# Patient Record
Sex: Female | Born: 1985 | Race: Black or African American | Hispanic: No | Smoking: Never smoker
Health system: Southern US, Community
[De-identification: ages and names within clinical notes are randomized; demographics above are authoritative.]

## PROBLEM LIST (undated history)

## (undated) ENCOUNTER — Emergency Department (HOSPITAL_COMMUNITY): Admission: EM | Payer: Self-pay | Source: Home / Self Care

## (undated) HISTORY — PX: MULTIPLE TOOTH EXTRACTIONS: SHX2053

---

## 2006-08-05 ENCOUNTER — Emergency Department (HOSPITAL_COMMUNITY): Admission: EM | Admit: 2006-08-05 | Discharge: 2006-08-05 | Payer: Self-pay | Admitting: Emergency Medicine

## 2011-11-23 ENCOUNTER — Inpatient Hospital Stay (HOSPITAL_COMMUNITY)
Admission: AD | Admit: 2011-11-23 | Discharge: 2011-11-23 | Discharge status: Against Medical Advice | Payer: Medicaid Other | Source: Ambulatory Visit | Attending: Obstetrics & Gynecology | Admitting: Obstetrics & Gynecology

## 2011-11-23 DIAGNOSIS — O99891 Other specified diseases and conditions complicating pregnancy: Secondary | ICD-10-CM | POA: Insufficient documentation

## 2011-11-23 DIAGNOSIS — N6009 Solitary cyst of unspecified breast: Secondary | ICD-10-CM | POA: Insufficient documentation

## 2011-11-23 DIAGNOSIS — O210 Mild hyperemesis gravidarum: Secondary | ICD-10-CM | POA: Insufficient documentation

## 2011-11-23 LAB — URINALYSIS, ROUTINE W REFLEX MICROSCOPIC
Hgb urine dipstick: NEGATIVE
Leukocytes, UA: NEGATIVE
Nitrite: NEGATIVE
Specific Gravity, Urine: 1.015 (ref 1.005–1.030)
Urobilinogen, UA: 1 mg/dL (ref 0.0–1.0)

## 2011-11-23 LAB — POCT PREGNANCY, URINE: Preg Test, Ur: POSITIVE — AB

## 2011-11-23 NOTE — MAU Note (Signed)
Patient states she has had a positive pregnancy test at a non [facility] on 6-5. Was told she had cysts in the breast and was told she needed a mammogram. Patient denies any bleeding or vaginal discharge. Has had nausea and vomiting 2-3 times a day for 2 weeks. Diarrhea daily for one week, once today.

## 2011-11-26 ENCOUNTER — Encounter (HOSPITAL_COMMUNITY): Payer: Self-pay | Admitting: *Deleted

## 2011-11-29 ENCOUNTER — Encounter (HOSPITAL_COMMUNITY): Payer: Self-pay | Admitting: *Deleted

## 2017-01-24 DIAGNOSIS — Z01419 Encounter for gynecological examination (general) (routine) without abnormal findings: Secondary | ICD-10-CM | POA: Diagnosis not present

## 2017-01-24 DIAGNOSIS — Z6835 Body mass index (BMI) 35.0-35.9, adult: Secondary | ICD-10-CM | POA: Diagnosis not present

## 2017-03-06 DIAGNOSIS — F419 Anxiety disorder, unspecified: Secondary | ICD-10-CM | POA: Diagnosis not present

## 2017-04-04 DIAGNOSIS — Z23 Encounter for immunization: Secondary | ICD-10-CM | POA: Diagnosis not present

## 2017-04-04 DIAGNOSIS — F419 Anxiety disorder, unspecified: Secondary | ICD-10-CM | POA: Diagnosis not present

## 2017-06-27 DIAGNOSIS — K080 Exfoliation of teeth due to systemic causes: Secondary | ICD-10-CM | POA: Diagnosis not present

## 2017-08-29 DIAGNOSIS — R109 Unspecified abdominal pain: Secondary | ICD-10-CM | POA: Diagnosis not present

## 2017-09-24 DIAGNOSIS — K080 Exfoliation of teeth due to systemic causes: Secondary | ICD-10-CM | POA: Diagnosis not present

## 2017-10-23 DIAGNOSIS — F419 Anxiety disorder, unspecified: Secondary | ICD-10-CM | POA: Diagnosis not present

## 2017-12-09 DIAGNOSIS — Z3202 Encounter for pregnancy test, result negative: Secondary | ICD-10-CM | POA: Diagnosis not present

## 2017-12-09 DIAGNOSIS — Z30430 Encounter for insertion of intrauterine contraceptive device: Secondary | ICD-10-CM | POA: Diagnosis not present

## 2018-01-06 DIAGNOSIS — N939 Abnormal uterine and vaginal bleeding, unspecified: Secondary | ICD-10-CM | POA: Diagnosis not present

## 2018-02-17 ENCOUNTER — Other Ambulatory Visit: Payer: Self-pay

## 2018-02-17 ENCOUNTER — Emergency Department (HOSPITAL_COMMUNITY)
Admission: EM | Admit: 2018-02-17 | Discharge: 2018-02-17 | Disposition: A | Discharge status: Home / Self Care | Payer: Federal, State, Local not specified - PPO | Attending: Emergency Medicine | Admitting: Emergency Medicine

## 2018-02-17 ENCOUNTER — Encounter (HOSPITAL_COMMUNITY): Payer: Self-pay

## 2018-02-17 ENCOUNTER — Emergency Department (HOSPITAL_COMMUNITY): Payer: Federal, State, Local not specified - PPO

## 2018-02-17 DIAGNOSIS — N760 Acute vaginitis: Secondary | ICD-10-CM | POA: Diagnosis not present

## 2018-02-17 DIAGNOSIS — B9689 Other specified bacterial agents as the cause of diseases classified elsewhere: Secondary | ICD-10-CM | POA: Diagnosis not present

## 2018-02-17 DIAGNOSIS — N83201 Unspecified ovarian cyst, right side: Secondary | ICD-10-CM | POA: Insufficient documentation

## 2018-02-17 DIAGNOSIS — R102 Pelvic and perineal pain: Secondary | ICD-10-CM | POA: Diagnosis not present

## 2018-02-17 LAB — CBC WITH DIFFERENTIAL/PLATELET
BASOS PCT: 0 %
Basophils Absolute: 0 10*3/uL (ref 0.0–0.1)
EOS ABS: 0.2 10*3/uL (ref 0.0–0.7)
Eosinophils Relative: 4 %
HCT: 37.4 % (ref 36.0–46.0)
Hemoglobin: 12.1 g/dL (ref 12.0–15.0)
LYMPHS ABS: 1.7 10*3/uL (ref 0.7–4.0)
Lymphocytes Relative: 34 %
MCH: 28.3 pg (ref 26.0–34.0)
MCHC: 32.4 g/dL (ref 30.0–36.0)
MCV: 87.6 fL (ref 78.0–100.0)
Monocytes Absolute: 0.6 10*3/uL (ref 0.1–1.0)
Monocytes Relative: 11 %
NEUTROS ABS: 2.6 10*3/uL (ref 1.7–7.7)
NEUTROS PCT: 51 %
Platelets: 382 10*3/uL (ref 150–400)
RBC: 4.27 MIL/uL (ref 3.87–5.11)
RDW: 13.5 % (ref 11.5–15.5)
WBC: 5.1 10*3/uL (ref 4.0–10.5)

## 2018-02-17 LAB — WET PREP, GENITAL
Sperm: NONE SEEN
TRICH WET PREP: NONE SEEN
Yeast Wet Prep HPF POC: NONE SEEN

## 2018-02-17 LAB — URINALYSIS, ROUTINE W REFLEX MICROSCOPIC
Bilirubin Urine: NEGATIVE
GLUCOSE, UA: NEGATIVE mg/dL
Hgb urine dipstick: NEGATIVE
Ketones, ur: NEGATIVE mg/dL
LEUKOCYTES UA: NEGATIVE
Nitrite: NEGATIVE
PH: 7 (ref 5.0–8.0)
Protein, ur: NEGATIVE mg/dL
SPECIFIC GRAVITY, URINE: 1.02 (ref 1.005–1.030)

## 2018-02-17 LAB — COMPREHENSIVE METABOLIC PANEL
ALK PHOS: 61 U/L (ref 38–126)
ALT: 14 U/L (ref 0–44)
ANION GAP: 8 (ref 5–15)
AST: 17 U/L (ref 15–41)
Albumin: 3.6 g/dL (ref 3.5–5.0)
BUN: 10 mg/dL (ref 6–20)
CALCIUM: 9 mg/dL (ref 8.9–10.3)
CO2: 27 mmol/L (ref 22–32)
Chloride: 107 mmol/L (ref 98–111)
Creatinine, Ser: 0.61 mg/dL (ref 0.44–1.00)
GFR calc non Af Amer: >60 mL/min (ref >60)
Glucose, Bld: 86 mg/dL (ref 70–99)
POTASSIUM: 3.8 mmol/L (ref 3.5–5.1)
SODIUM: 142 mmol/L (ref 135–145)
Total Bilirubin: 0.4 mg/dL (ref 0.3–1.2)
Total Protein: 6.7 g/dL (ref 6.5–8.1)

## 2018-02-17 LAB — LIPASE, BLOOD: Lipase: 37 U/L (ref 11–51)

## 2018-02-17 LAB — POC URINE PREG, ED: PREG TEST UR: NEGATIVE

## 2018-02-17 MED ORDER — METRONIDAZOLE 500 MG PO TABS: 500.0000 mg | ORAL_TABLET | Freq: Two times a day (BID) | ORAL | 0 refills | Status: DC

## 2018-02-17 MED ORDER — KETOROLAC TROMETHAMINE 15 MG/ML IJ SOLN
15.0000 mg | Freq: Once | INTRAMUSCULAR | Status: AC
  Administered 2018-02-17: 15 mg via INTRAVENOUS
  Filled 2018-02-17: qty 1

## 2018-02-17 MED ORDER — NAPROXEN 500 MG PO TABS: 500.0000 mg | ORAL_TABLET | Freq: Two times a day (BID) | ORAL | 0 refills | Status: DC

## 2018-02-17 NOTE — Discharge Instructions (Addendum)
You were seen in the emergency department today for abdominal pain.  Your lab work was all normal with the exception of the blood prep we did with your pelvic exam which showed findings consistent with bacterial vaginosis, please see the attached handout for further information regarding this diagnosis.  We are treating this infection with Flagyl, this is an antibiotic, please complete the course of antibiotics prescribed to you.  You may not consume alcohol when taking this antibiotic as it can have severe side effects.  Your ultrasound showed that you have a likely ovarian cyst on the right side that is 7.5 cm in size.  We are treating this with anti-inflammatory medicines, we are specifically give you a prescription for naproxen. - Naproxen is a nonsteroidal anti-inflammatory medication that will help with pain and swelling. Be sure to take this medication as prescribed with food, 1 pill every 12 hours,  It should be taken with food, as it can cause stomach upset, and more seriously, stomach bleeding. Do not take other nonsteroidal anti-inflammatory medications with this such as Advil, Motrin, Aleve, Mobic, Goodie Powder, or Motrin.    You make take Tylenol per over the counter dosing with these medications.   We have prescribed you new medication(s) today. Discuss the medications prescribed today with your pharmacist as they can have adverse effects and interactions with your other medicines including over the counter and prescribed medications. Seek medical evaluation if you start to experience new or abnormal symptoms after taking one of these medicines, seek care immediately if you start to experience difficulty breathing, feeling of your throat closing, facial swelling, or rash as these could be indications of a more serious allergic reaction.   We would like you to call your OB/GYN provider tomorrow morning in order to set up a follow-up appointment sometime this week.  Return to the ER for new or  worsening symptoms including but not limited to worsening pain, vomiting, fever, or any other concerns that you may have.

## 2018-02-17 NOTE — ED Provider Notes (Signed)
[facility]-EMERGENCY DEPT Provider Note   CSN: [id number] Arrival date & time: 02/17/18  1358     History   Chief Complaint Chief Complaint  Patient presents with  . Abdominal Pain    HPI Michelle Wade is a 32 y.o. female without significant past medical history who presents to the emergency department today with complaints of pelvic pain that started this AM when she woke up.  Patient describes the pain as being in the right suprapubic/pelvic region.  Pain has been constant since onset this morning.  Pain is currently a 2 out of 10 in severity, it is worse with walking around and movement, improved with rest.  She reports that she had a similar pain 2 months prior which lasted for 3 to 4 days, she was seen by OB/GYN provider and ultrasound was recommended-she did not have this completed as the pain had spontaneously resolved.  Today with the first time she has had recurrence of pain.  She states that 2 months ago she had a Mirena placed (first episode of similar pain was prior to mirena placement).  She states that she did have approximately 20 days of light menstrual bleeding, this resolved and she did have a little bit of bleeding/spotting yesterday, none today.  She has had some urinary frequency as well as white vaginal discharge- discharge has been occurring since IUD placement.  Denies fever, chills, nausea, vomiting, decreased appetite, diarrhea, blood in stool, or dysuria.  Patient is currently sexually active in a monogamous relationship with her husband.  HPI  No past medical history on file.  There are no active problems to display for this patient.   History reviewed. No pertinent surgical history.   OB History   None      Home Medications    Prior to Admission medications   Not on File    Family History No family history on file.  Social History Social History   Tobacco Use  . Smoking status: Never Smoker  . Smokeless tobacco:  Never Used  Substance Use Topics  . Alcohol use: Not on file  . Drug use: Not on file     Allergies   Patient has no known allergies.   Review of Systems Review of Systems  Constitutional: Negative for appetite change, chills and fever.  Respiratory: Negative for shortness of breath.   Cardiovascular: Negative for chest pain.  Gastrointestinal: Negative for anal bleeding, blood in stool, constipation, diarrhea, nausea and vomiting.  Genitourinary: Positive for frequency, pelvic pain, vaginal bleeding (yesterday, resolved today) and vaginal discharge. Negative for dysuria.  All other systems reviewed and are negative.   Physical Exam Updated Vital Signs BP 121/86 (BP Location: Left Arm)   Pulse 74   Temp 98.7 F (37.1 C) (Oral)   Resp 16   Wt 94.1 kg   LMP 02/04/2018 (Approximate)   SpO2 95%   BMI 33.50 kg/m   Physical Exam  Constitutional: She appears well-developed and well-nourished.  Non-toxic appearance. No distress.  HENT:  Head: Normocephalic and atraumatic.  Eyes: Conjunctivae are normal. Right eye exhibits no discharge. Left eye exhibits no discharge.  Neck: Neck supple.  Cardiovascular: Normal rate and regular rhythm.  Pulmonary/Chest: Effort normal and breath sounds normal. No respiratory distress. She has no wheezes. She has no rhonchi. She has no rales.  Respiration even and unlabored  Abdominal: Soft. She exhibits no distension. There is tenderness (Patient with diffuse lower abdominal tenderness, most prominent in the right suprapubic  region.). There is no rigidity, no rebound, no guarding and no CVA tenderness.  Genitourinary: Pelvic exam was performed with patient supine. There is no lesion on the right labia. There is no lesion on the left labia. Cervix exhibits discharge (white thin). Cervix exhibits no friability. Right adnexum displays tenderness. Right adnexum displays no mass and no fullness. Left adnexum displays tenderness. Left adnexum displays no  mass and no fullness. No erythema or bleeding in the vagina. No foreign body in the vagina. Vaginal discharge (thin white) found.  Genitourinary Comments: Patient diffusely tender throughout pelvic exam, right adnexa is most significant area of tenderness.  Cervical os is closed with visible IUD strings.   EDT present as chaperone.   Neurological: She is alert.  Clear speech.   Skin: Skin is warm and dry. No rash noted.  Psychiatric: She has a normal mood and affect. Her behavior is normal.  Nursing note and vitals reviewed.   ED Treatments / Results  Labs (all labs ordered are listed, but only abnormal results are displayed) Labs Reviewed  WET PREP, GENITAL - Abnormal; Notable for the following components:      Result Value   Clue Cells Wet Prep HPF POC PRESENT (*)    WBC, Wet Prep HPF POC FEW (*)    All other components within normal limits  COMPREHENSIVE METABOLIC PANEL  LIPASE, BLOOD  CBC WITH DIFFERENTIAL/PLATELET  URINALYSIS, ROUTINE W REFLEX MICROSCOPIC  RPR  HIV ANTIBODY (ROUTINE TESTING)  POC URINE PREG, ED  GC/CHLAMYDIA PROBE AMP ([place]) NOT AT [facility]    EKG None  Radiology US Transvaginal Non-ob  Result Date: 02/17/2018 CLINICAL DATA:  Right pelvic pain for 5 months EXAM: TRANSABDOMINAL AND TRANSVAGINAL ULTRASOUND OF PELVIS DOPPLER ULTRASOUND OF OVARIES TECHNIQUE: Both transabdominal and transvaginal ultrasound examinations of the pelvis were performed. Transabdominal technique was performed for global imaging of the pelvis including uterus, ovaries, adnexal regions, and pelvic cul-de-sac. It was necessary to proceed with endovaginal exam following the transabdominal exam to visualize the ovaries. Color and duplex Doppler ultrasound was utilized to evaluate blood flow to the ovaries. COMPARISON:  None. FINDINGS: Uterus Measurements: 9.7 x 4.6 x 5 cm. No fibroids or other mass visualized. Nabothian cyst in the cervix. Endometrium Thickness: 8 mm.  Intrauterine device  in place Right ovary Measurements: 7.3 x 6.7 x 8.3 cm.  Cyst measuring 5.8 x 7.5 x 7.4 cm Left ovary Measurements: 4.1 x 2.1 x 2.5 cm.  Cyst measuring 3.2 x 1.3 x 1.6 cm Pulsed Doppler evaluation of both ovaries demonstrates normal low-resistance arterial and venous waveforms. Other findings No abnormal free fluid. IMPRESSION: 1. Negative for ovarian torsion. 2. 7.5 cm probable cyst in the right ovary, since these may be difficult to assess completely with [place], further evaluation of simple-appearing cysts >7 cm with MRI or surgical evaluation is recommended according to the Society of Radiologists in Ultrasound 2010 Consensus Conference Statement (D Lenis Noon et al. Management of Asymptomatic Ovarian and other Adnexal Cysts Imaged at [place]: Society of Radiologists in Ultrasound Consensus Conference Statement 2010. Radiology 256 (Sept 2010): 943-954.). This may be performed on a nonemergent basis. 3. Intrauterine device appears appropriately positioned by sonography Electronically Signed   By: Jasmine Pang M.D.   On: 02/17/2018 18:42   US Pelvis Complete  Result Date: 02/17/2018 CLINICAL DATA:  Right pelvic pain for 5 months EXAM: TRANSABDOMINAL AND TRANSVAGINAL ULTRASOUND OF PELVIS DOPPLER ULTRASOUND OF OVARIES TECHNIQUE: Both transabdominal and transvaginal ultrasound examinations of the pelvis were performed.  Transabdominal technique was performed for global imaging of the pelvis including uterus, ovaries, adnexal regions, and pelvic cul-de-sac. It was necessary to proceed with endovaginal exam following the transabdominal exam to visualize the ovaries. Color and duplex Doppler ultrasound was utilized to evaluate blood flow to the ovaries. COMPARISON:  None. FINDINGS: Uterus Measurements: 9.7 x 4.6 x 5 cm. No fibroids or other mass visualized. Nabothian cyst in the cervix. Endometrium Thickness: 8 mm.  Intrauterine device in place Right ovary Measurements: 7.3 x 6.7 x 8.3 cm.  Cyst measuring 5.8 x 7.5 x 7.4 cm Left  ovary Measurements: 4.1 x 2.1 x 2.5 cm.  Cyst measuring 3.2 x 1.3 x 1.6 cm Pulsed Doppler evaluation of both ovaries demonstrates normal low-resistance arterial and venous waveforms. Other findings No abnormal free fluid. IMPRESSION: 1. Negative for ovarian torsion. 2. 7.5 cm probable cyst in the right ovary, since these may be difficult to assess completely with [place], further evaluation of simple-appearing cysts >7 cm with MRI or surgical evaluation is recommended according to the Society of Radiologists in Ultrasound 2010 Consensus Conference Statement (D Lenis Noon et al. Management of Asymptomatic Ovarian and other Adnexal Cysts Imaged at [place]: Society of Radiologists in Ultrasound Consensus Conference Statement 2010. Radiology 256 (Sept 2010): 943-954.). This may be performed on a nonemergent basis. 3. Intrauterine device appears appropriately positioned by sonography Electronically Signed   By: Jasmine Pang M.D.   On: 02/17/2018 18:42   [place] Art/ven Flow Abd Pelv Doppler  Result Date: 02/17/2018 CLINICAL DATA:  Right pelvic pain for 5 months EXAM: TRANSABDOMINAL AND TRANSVAGINAL ULTRASOUND OF PELVIS DOPPLER ULTRASOUND OF OVARIES TECHNIQUE: Both transabdominal and transvaginal ultrasound examinations of the pelvis were performed. Transabdominal technique was performed for global imaging of the pelvis including uterus, ovaries, adnexal regions, and pelvic cul-de-sac. It was necessary to proceed with endovaginal exam following the transabdominal exam to visualize the ovaries. Color and duplex Doppler ultrasound was utilized to evaluate blood flow to the ovaries. COMPARISON:  None. FINDINGS: Uterus Measurements: 9.7 x 4.6 x 5 cm. No fibroids or other mass visualized. Nabothian cyst in the cervix. Endometrium Thickness: 8 mm.  Intrauterine device in place Right ovary Measurements: 7.3 x 6.7 x 8.3 cm.  Cyst measuring 5.8 x 7.5 x 7.4 cm Left ovary Measurements: 4.1 x 2.1 x 2.5 cm.  Cyst measuring 3.2 x 1.3 x 1.6 cm Pulsed  Doppler evaluation of both ovaries demonstrates normal low-resistance arterial and venous waveforms. Other findings No abnormal free fluid. IMPRESSION: 1. Negative for ovarian torsion. 2. 7.5 cm probable cyst in the right ovary, since these may be difficult to assess completely with [place], further evaluation of simple-appearing cysts >7 cm with MRI or surgical evaluation is recommended according to the Society of Radiologists in Ultrasound 2010 Consensus Conference Statement (D Lenis Noon et al. Management of Asymptomatic Ovarian and other Adnexal Cysts Imaged at [place]: Society of Radiologists in Ultrasound Consensus Conference Statement 2010. Radiology 256 (Sept 2010): 943-954.). This may be performed on a nonemergent basis. 3. Intrauterine device appears appropriately positioned by sonography Electronically Signed   By: Jasmine Pang M.D.   On: 02/17/2018 18:42    Procedures Procedures (including critical care time)  Medications Ordered in ED Medications  ketorolac (TORADOL) 15 MG/ML injection 15 mg (15 mg Intravenous Given 02/17/18 1932)     Initial Impression / Assessment and Plan / ED Course  I have reviewed the triage vital signs and the nursing notes.  Pertinent labs & imaging results that were available during  my care of the patient were reviewed by me and considered in my medical decision making (see chart for details).   Patient presents to the emergency department with complaints of right-sided pelvic pain.  Patient nontoxic-appearing, no apparent distress, vitals WNL.  On exam patient is tender to the diffuse lower abdomen, R>L, as well as with bimanual exam with R adnexal area being most prominent area of tenderness.  Will further evaluate with abdominal labs as well as transvaginal/pelvic ultrasound.  Work-up reviewed and fairly unremarkable.  Labs without leukocytosis, anemia, or electrolyte disturbance.  Renal function, LFTs, and lipase are within normal limits.  Patient's urinalysis does not  appear consistent with obvious infection.  Pregnancy test is negative, therefore do not suspect ectopic pregnancy.  Her wet prep is consistent with bacterial vaginosis, given her reports of vaginal discharge feel this is reasonable to treat with Flagyl, discussed she may not consume alcohol when taking this antibiotic.  Her ultrasound is notable for a 7.5 cm probable cyst in the right ovary, recommendations for nonemergent further evaluation via surgery or MRI, ultrasound testing negative for torsion.  IUD appears to be in place.  We will start patient on Flagyl for treatment of bacterial vaginosis.  NSAIDs for her likely ovarian cyst with close follow up with her OBGYN provider. I discussed results, treatment plan, need for follow-up, and return precautions with the patient. Provided opportunity for questions, patient confirmed understanding and is in agreement with plan.   Findings and plan of care discussed with supervising physician Dr. Rush Landmark who is in agreement.   Final Clinical Impressions(s) / ED Diagnoses   Final diagnoses:  Cyst of right ovary  Bacterial vaginosis    ED Discharge Orders         Ordered    naproxen (NAPROSYN) 500 MG tablet  2 times daily     02/17/18 1903    metroNIDAZOLE (FLAGYL) 500 MG tablet  2 times daily     02/17/18 1903           Cherly Anderson, PA-C 02/17/18 2216    Tegeler, Canary Brim, MD 02/18/18 0025

## 2018-02-17 NOTE — ED Notes (Signed)
URINE SAMPLE AT BEDSIDE 

## 2018-02-17 NOTE — ED Triage Notes (Signed)
She c/o rlq area abd. Discomfort. She states she had this about two months ago, and her Ob/gyn at that time recommended u/s; the follow up of which she did not do. She further tells me that she has had a Mirena device for two months and that her two most recent periods "lassted about twenty days". She is in no distress.

## 2018-02-18 LAB — HIV ANTIBODY (ROUTINE TESTING W REFLEX): HIV Screen 4th Generation wRfx: NONREACTIVE

## 2018-02-18 LAB — GC/CHLAMYDIA PROBE AMP (~~LOC~~) NOT AT ARMC
Chlamydia: NEGATIVE
Neisseria Gonorrhea: NEGATIVE

## 2018-02-18 LAB — RPR: RPR: NONREACTIVE

## 2018-02-18 NOTE — ED Notes (Signed)
Scripts called in-okay by pickering

## 2018-04-02 DIAGNOSIS — N939 Abnormal uterine and vaginal bleeding, unspecified: Secondary | ICD-10-CM | POA: Diagnosis not present

## 2018-04-02 DIAGNOSIS — N83292 Other ovarian cyst, left side: Secondary | ICD-10-CM | POA: Diagnosis not present

## 2018-12-30 DIAGNOSIS — K6289 Other specified diseases of anus and rectum: Secondary | ICD-10-CM | POA: Diagnosis not present

## 2019-01-02 DIAGNOSIS — K644 Residual hemorrhoidal skin tags: Secondary | ICD-10-CM | POA: Diagnosis not present

## 2019-04-06 IMAGING — US US TRANSVAGINAL NON-OB
1 series · 13 of 25 positions shown · non-contrast
Comparison: None.

CLINICAL DATA: Right pelvic pain for 5 months

EXAM:
TRANSABDOMINAL AND TRANSVAGINAL ULTRASOUND OF PELVIS
DOPPLER ULTRASOUND OF OVARIES
TECHNIQUE: Both transabdominal and transvaginal ultrasound examinations of the
pelvis were performed. Transabdominal technique was performed for
global imaging of the pelvis including uterus, ovaries, adnexal
regions, and pelvic cul-de-sac.
It was necessary to proceed with endovaginal exam following the
transabdominal exam to visualize the ovaries. Color and duplex
Doppler ultrasound was utilized to evaluate blood flow to the
ovaries.

[Series 1: us transvaginal non-ob · 0.24mm/px · 119 acquisitions, 13 frames shown]
[im 1/119]
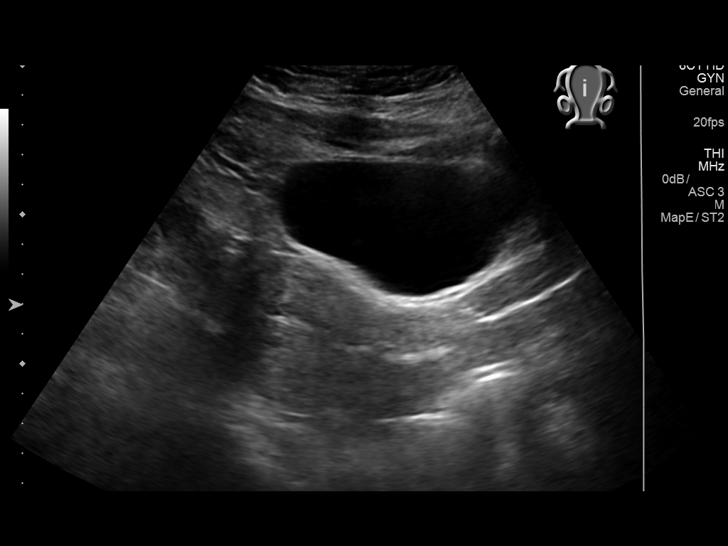
[im 10/119]
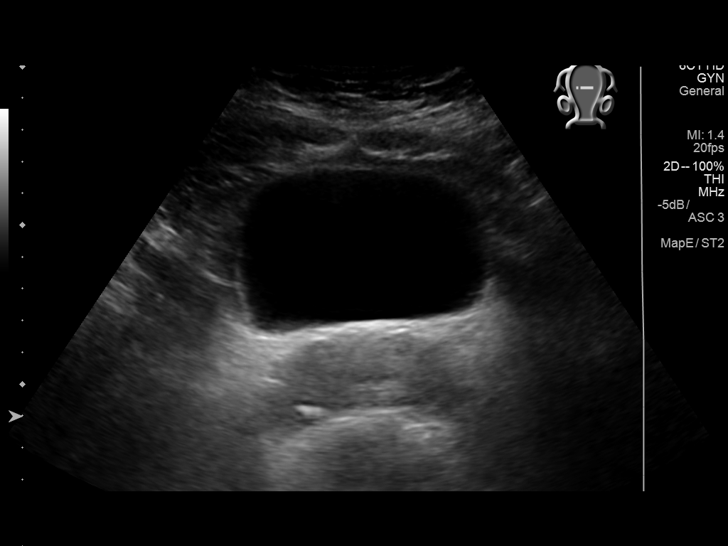
[im 20/119]
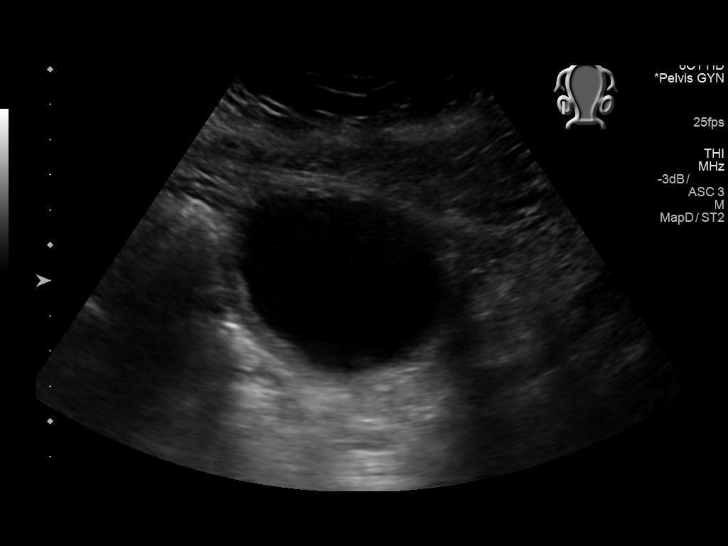
[im 30/119]
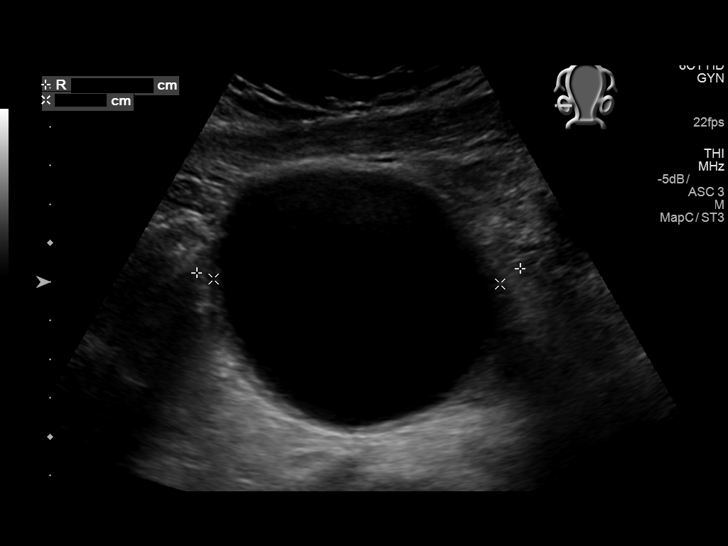
[im 40/119]
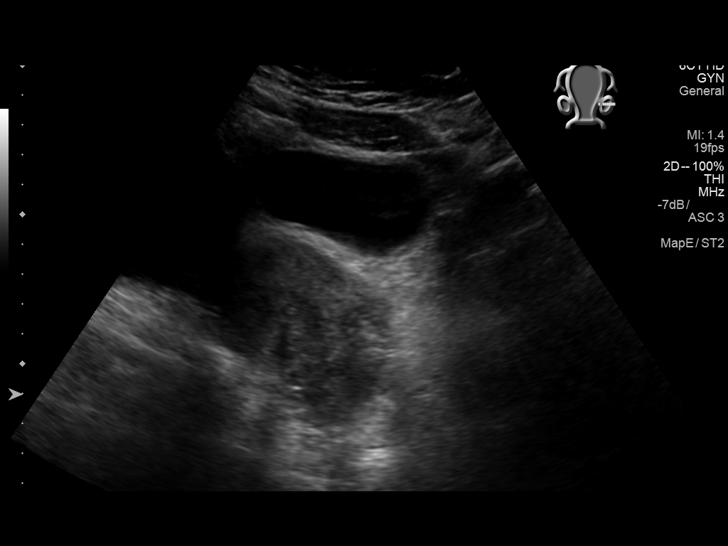
[im 50/119]
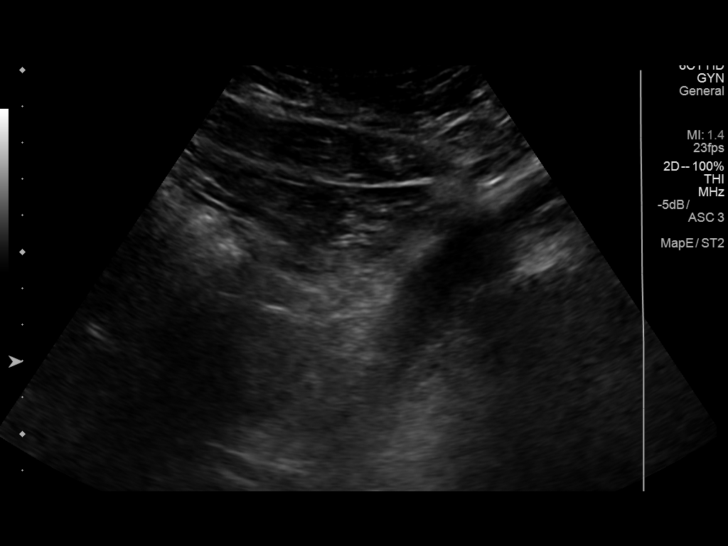
[im 60/119]
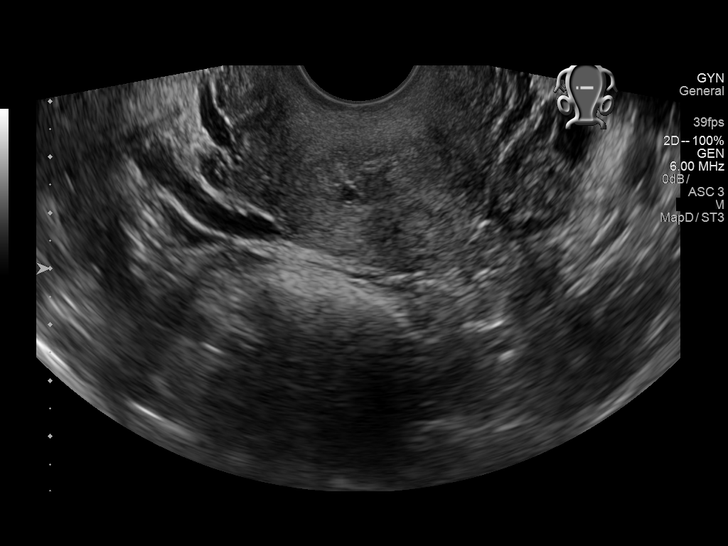
[im 69/119]
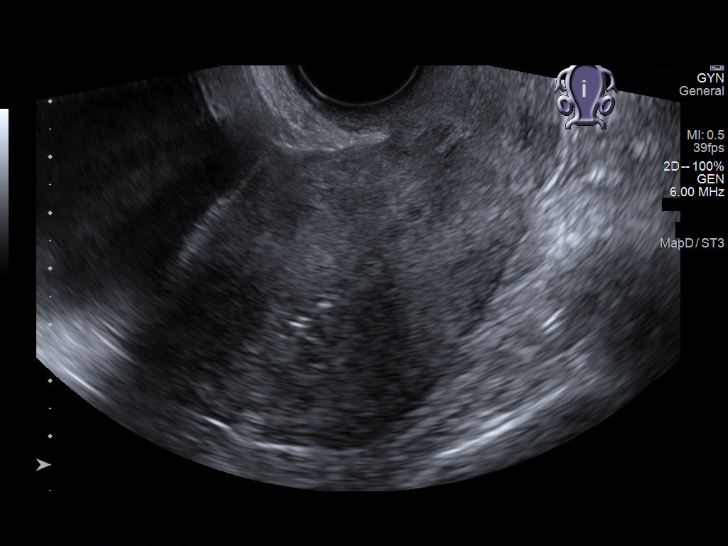
[im 79/119]
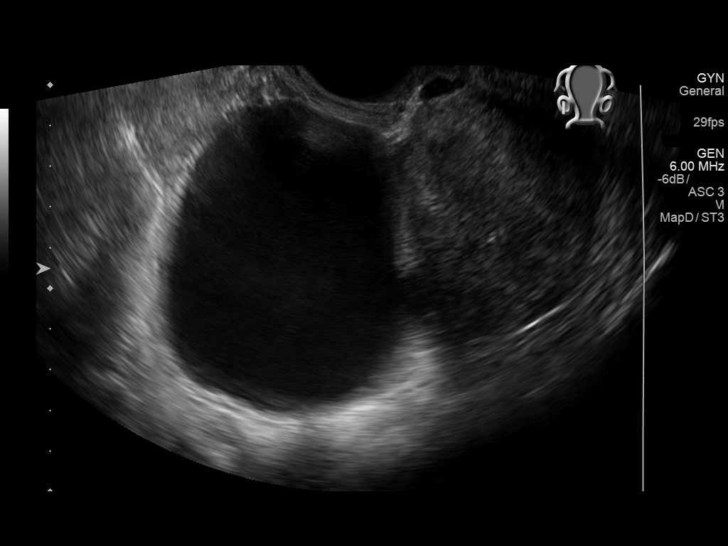
[im 89/119]
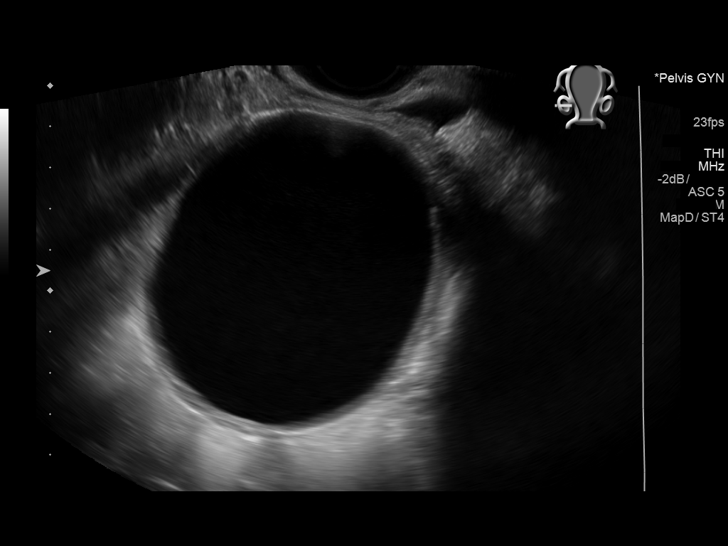
[im 99/119]
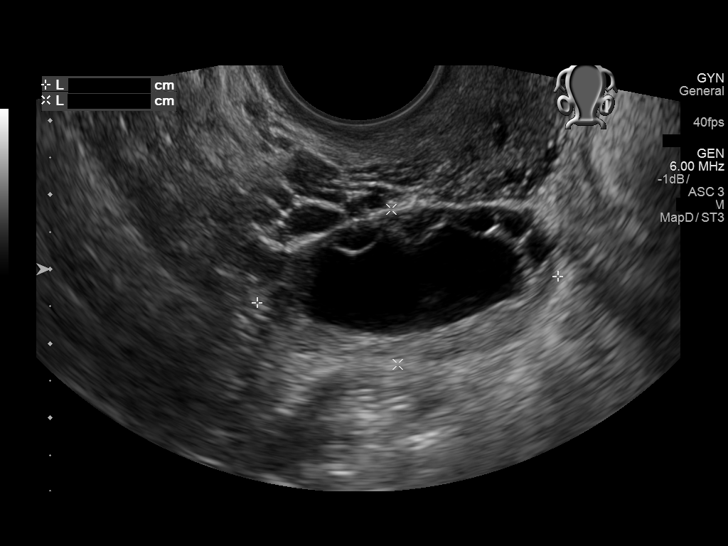
[im 109/119]
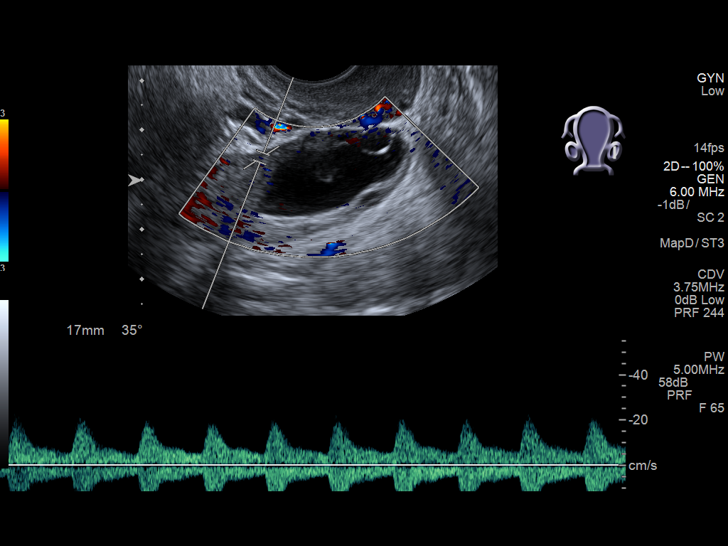
[im 119/119]
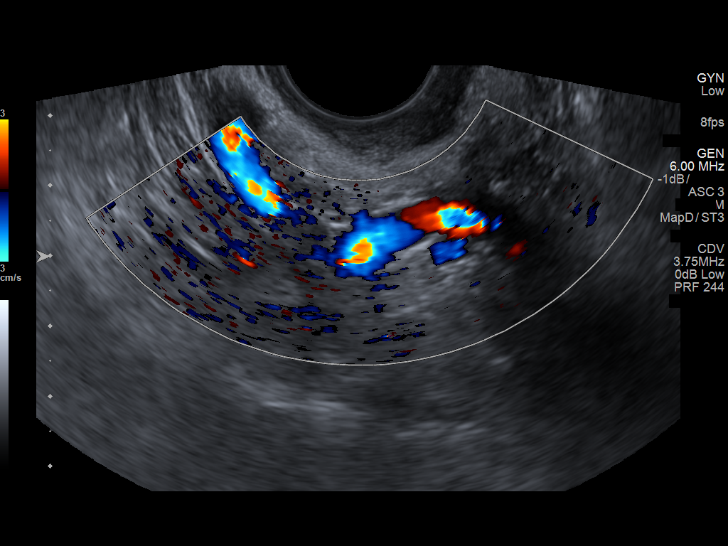

[13 of 25 positions shown; findings below may reference images not displayed]

FINDINGS: Uterus

Measurements: 9.7 x 4.6 x 5 cm. No fibroids or other mass
visualized. Nabothian cyst in the cervix.

Endometrium

Thickness: 8 mm.  Intrauterine device in place

Right ovary

Measurements: 7.3 x 6.7 x 8.3 cm.  Cyst measuring 5.8 x 7.5 x 7.4 cm

Left ovary

Measurements: 4.1 x 2.1 x 2.5 cm.  Cyst measuring 3.2 x 1.3 x 1.6 cm

Pulsed Doppler evaluation of both ovaries demonstrates normal
low-resistance arterial and venous waveforms.

Other findings

No abnormal free fluid.
IMPRESSION: 1. Negative for ovarian torsion.
2. 7.5 cm probable cyst in the right ovary, since these may be
difficult to assess completely with US, further evaluation of
simple-appearing cysts >7 cm with MRI or surgical evaluation is
recommended according to the Society of Radiologists in Ultrasound
4121 Consensus Conference Statement (Rondell Uno et al. Management of
Asymptomatic Ovarian and other Adnexal Cysts Imaged at [place]: Society
of Radiologists in Ultrasound Consensus Conference Statement 4121.
Radiology [DATE]): [phone].). This may be performed on a
nonemergent basis.
3. Intrauterine device appears appropriately positioned by
sonography

## 2019-07-13 DIAGNOSIS — Z20828 Contact with and (suspected) exposure to other viral communicable diseases: Secondary | ICD-10-CM | POA: Diagnosis not present

## 2019-07-13 DIAGNOSIS — U071 COVID-19: Secondary | ICD-10-CM

## 2019-07-13 DIAGNOSIS — Z20822 Contact with and (suspected) exposure to covid-19: Secondary | ICD-10-CM | POA: Diagnosis not present

## 2019-07-13 HISTORY — DX: COVID-19: U07.1

## 2019-09-04 DIAGNOSIS — Z309 Encounter for contraceptive management, unspecified: Secondary | ICD-10-CM | POA: Diagnosis not present

## 2019-09-04 DIAGNOSIS — Z30432 Encounter for removal of intrauterine contraceptive device: Secondary | ICD-10-CM | POA: Diagnosis not present

## 2019-09-04 DIAGNOSIS — N939 Abnormal uterine and vaginal bleeding, unspecified: Secondary | ICD-10-CM | POA: Diagnosis not present

## 2019-09-10 ENCOUNTER — Other Ambulatory Visit: Payer: Self-pay | Admitting: Obstetrics and Gynecology

## 2019-09-15 ENCOUNTER — Encounter (HOSPITAL_BASED_OUTPATIENT_CLINIC_OR_DEPARTMENT_OTHER): Payer: Self-pay | Admitting: Obstetrics and Gynecology

## 2019-09-15 ENCOUNTER — Other Ambulatory Visit: Payer: Self-pay

## 2019-09-15 NOTE — Progress Notes (Addendum)
Spoke w/ via phone for pre-op interview---patient Lab needs dos----  Cbc, serum pregnancy             COVID test ------positive covid test 07-13-2019 [facility] results on chart Arrive at -------1130 am 09-22-2019 NPO after ------food, clear liquids until 730 am then npo Medications to take morning of surgery -----none Diabetic medication -----n/a Patient Special Instructions ----- Pre-Op special Istructions ----- Patient verbalized understanding of instructions that were given at this phone interview. Patient denies shortness of breath, chest pain, fever, cough a this phone interview.

## 2019-09-22 ENCOUNTER — Ambulatory Visit (HOSPITAL_BASED_OUTPATIENT_CLINIC_OR_DEPARTMENT_OTHER): Payer: Federal, State, Local not specified - PPO | Admitting: Anesthesiology

## 2019-09-22 ENCOUNTER — Ambulatory Visit (HOSPITAL_BASED_OUTPATIENT_CLINIC_OR_DEPARTMENT_OTHER)
Admission: RE | Admit: 2019-09-22 | Discharge: 2019-09-22 | Disposition: A | Discharge status: Home / Self Care | Payer: Federal, State, Local not specified - PPO | Attending: Obstetrics and Gynecology | Admitting: Obstetrics and Gynecology

## 2019-09-22 ENCOUNTER — Encounter (HOSPITAL_BASED_OUTPATIENT_CLINIC_OR_DEPARTMENT_OTHER)
Admission: RE | Disposition: A | Discharge status: Home / Self Care | Payer: Self-pay | Source: Home / Self Care | Attending: Obstetrics and Gynecology

## 2019-09-22 ENCOUNTER — Other Ambulatory Visit: Payer: Self-pay

## 2019-09-22 ENCOUNTER — Encounter (HOSPITAL_BASED_OUTPATIENT_CLINIC_OR_DEPARTMENT_OTHER): Payer: Self-pay | Admitting: Obstetrics and Gynecology

## 2019-09-22 DIAGNOSIS — Z8616 Personal history of COVID-19: Secondary | ICD-10-CM | POA: Insufficient documentation

## 2019-09-22 DIAGNOSIS — Z302 Encounter for sterilization: Secondary | ICD-10-CM | POA: Diagnosis not present

## 2019-09-22 HISTORY — PX: LAPAROSCOPIC TUBAL LIGATION: SHX1937

## 2019-09-22 LAB — CBC
HCT: 39 % (ref 36.0–46.0)
Hemoglobin: 12.6 g/dL (ref 12.0–15.0)
MCH: 29 pg (ref 26.0–34.0)
MCHC: 32.3 g/dL (ref 30.0–36.0)
MCV: 89.9 fL (ref 80.0–100.0)
Platelets: 361 10*3/uL (ref 150–400)
RBC: 4.34 MIL/uL (ref 3.87–5.11)
RDW: 12.6 % (ref 11.5–15.5)
WBC: 5.4 10*3/uL (ref 4.0–10.5)
nRBC: 0 % (ref 0.0–0.2)

## 2019-09-22 LAB — HCG, SERUM, QUALITATIVE: Preg, Serum: NEGATIVE

## 2019-09-22 SURGERY — LIGATION, FALLOPIAN TUBE, LAPAROSCOPIC
Anesthesia: General | Site: Abdomen | Laterality: Bilateral

## 2019-09-22 MED ORDER — BUPIVACAINE HCL (PF) 0.25 % IJ SOLN
INTRAMUSCULAR | Status: DC | PRN
  Administered 2019-09-22: 10 mL

## 2019-09-22 MED ORDER — CEFAZOLIN SODIUM-DEXTROSE 2-4 GM/100ML-% IV SOLN
2.0000 g | INTRAVENOUS | Status: AC
  Administered 2019-09-22: 2 g via INTRAVENOUS
  Filled 2019-09-22: qty 100

## 2019-09-22 MED ORDER — HYDROMORPHONE HCL 1 MG/ML IJ SOLN
INTRAMUSCULAR | Status: AC
  Filled 2019-09-22: qty 1

## 2019-09-22 MED ORDER — CEFAZOLIN SODIUM-DEXTROSE 2-4 GM/100ML-% IV SOLN
INTRAVENOUS | Status: AC
  Filled 2019-09-22: qty 100

## 2019-09-22 MED ORDER — KETOROLAC TROMETHAMINE 30 MG/ML IJ SOLN
INTRAMUSCULAR | Status: AC
  Filled 2019-09-22: qty 1

## 2019-09-22 MED ORDER — OXYCODONE HCL 5 MG/5ML PO SOLN
5.0000 mg | Freq: Once | ORAL | Status: AC | PRN
  Filled 2019-09-22: qty 5

## 2019-09-22 MED ORDER — OXYCODONE HCL 5 MG PO TABS
5.0000 mg | ORAL_TABLET | Freq: Once | ORAL | Status: AC | PRN
  Administered 2019-09-22: 5 mg via ORAL
  Filled 2019-09-22: qty 1

## 2019-09-22 MED ORDER — PROPOFOL 10 MG/ML IV BOLUS
INTRAVENOUS | Status: AC
  Filled 2019-09-22: qty 20

## 2019-09-22 MED ORDER — OXYCODONE HCL 5 MG PO TABS
ORAL_TABLET | ORAL | Status: AC
  Filled 2019-09-22: qty 1

## 2019-09-22 MED ORDER — ONDANSETRON HCL 4 MG/2ML IJ SOLN
INTRAMUSCULAR | Status: AC
  Filled 2019-09-22: qty 2

## 2019-09-22 MED ORDER — DEXAMETHASONE SODIUM PHOSPHATE 10 MG/ML IJ SOLN
INTRAMUSCULAR | Status: DC | PRN
  Administered 2019-09-22: 10 mg via INTRAVENOUS

## 2019-09-22 MED ORDER — LIDOCAINE 2% (20 MG/ML) 5 ML SYRINGE
INTRAMUSCULAR | Status: AC
  Filled 2019-09-22: qty 5

## 2019-09-22 MED ORDER — MIDAZOLAM HCL 2 MG/2ML IJ SOLN
INTRAMUSCULAR | Status: AC
  Filled 2019-09-22: qty 2

## 2019-09-22 MED ORDER — ROCURONIUM BROMIDE 10 MG/ML (PF) SYRINGE
PREFILLED_SYRINGE | INTRAVENOUS | Status: DC | PRN
  Administered 2019-09-22: 50 mg via INTRAVENOUS

## 2019-09-22 MED ORDER — PROPOFOL 10 MG/ML IV BOLUS
INTRAVENOUS | Status: DC | PRN
  Administered 2019-09-22: 50 mg via INTRAVENOUS
  Administered 2019-09-22: 150 mg via INTRAVENOUS

## 2019-09-22 MED ORDER — ACETAMINOPHEN 500 MG PO TABS
ORAL_TABLET | ORAL | Status: AC
  Filled 2019-09-22: qty 2

## 2019-09-22 MED ORDER — SUGAMMADEX SODIUM 200 MG/2ML IV SOLN
INTRAVENOUS | Status: DC | PRN
  Administered 2019-09-22: 400 mg via INTRAVENOUS

## 2019-09-22 MED ORDER — DEXAMETHASONE SODIUM PHOSPHATE 10 MG/ML IJ SOLN
INTRAMUSCULAR | Status: AC
  Filled 2019-09-22: qty 1

## 2019-09-22 MED ORDER — WHITE PETROLATUM EX OINT
TOPICAL_OINTMENT | CUTANEOUS | Status: AC
  Filled 2019-09-22: qty 5

## 2019-09-22 MED ORDER — FAMOTIDINE 20 MG PO TABS
ORAL_TABLET | ORAL | Status: AC
  Filled 2019-09-22: qty 1

## 2019-09-22 MED ORDER — FENTANYL CITRATE (PF) 100 MCG/2ML IJ SOLN
INTRAMUSCULAR | Status: AC
  Filled 2019-09-22: qty 2

## 2019-09-22 MED ORDER — SCOPOLAMINE 1 MG/3DAYS TD PT72
1.0000 | MEDICATED_PATCH | TRANSDERMAL | Status: DC
  Administered 2019-09-22: 1.5 mg via TRANSDERMAL
  Filled 2019-09-22: qty 1

## 2019-09-22 MED ORDER — SCOPOLAMINE 1 MG/3DAYS TD PT72
MEDICATED_PATCH | TRANSDERMAL | Status: AC
  Filled 2019-09-22: qty 1

## 2019-09-22 MED ORDER — ROCURONIUM BROMIDE 10 MG/ML (PF) SYRINGE
PREFILLED_SYRINGE | INTRAVENOUS | Status: AC
  Filled 2019-09-22: qty 10

## 2019-09-22 MED ORDER — HYDROMORPHONE HCL 1 MG/ML IJ SOLN
0.2500 mg | INTRAMUSCULAR | Status: DC | PRN
  Administered 2019-09-22: 0.25 mg via INTRAVENOUS
  Filled 2019-09-22: qty 0.5

## 2019-09-22 MED ORDER — ONDANSETRON HCL 4 MG/2ML IJ SOLN
INTRAMUSCULAR | Status: DC | PRN
  Administered 2019-09-22: 4 mg via INTRAVENOUS

## 2019-09-22 MED ORDER — SODIUM CHLORIDE (PF) 0.9 % IJ SOLN
INTRAMUSCULAR | Status: DC | PRN
  Administered 2019-09-22: 10 mL

## 2019-09-22 MED ORDER — FENTANYL CITRATE (PF) 250 MCG/5ML IJ SOLN
INTRAMUSCULAR | Status: DC | PRN
  Administered 2019-09-22 (×2): 100 ug via INTRAVENOUS

## 2019-09-22 MED ORDER — KETOROLAC TROMETHAMINE 30 MG/ML IJ SOLN
30.0000 mg | Freq: Once | INTRAMUSCULAR | Status: AC | PRN
  Administered 2019-09-22: 14:00:00 30 mg via INTRAVENOUS
  Filled 2019-09-22: qty 1

## 2019-09-22 MED ORDER — OXYCODONE-ACETAMINOPHEN 5-325 MG PO TABS: 1.0000 | ORAL_TABLET | ORAL | 0 refills | Status: DC | PRN

## 2019-09-22 MED ORDER — LIDOCAINE 2% (20 MG/ML) 5 ML SYRINGE
INTRAMUSCULAR | Status: DC | PRN
  Administered 2019-09-22: 100 mg via INTRAVENOUS

## 2019-09-22 MED ORDER — MIDAZOLAM HCL 5 MG/5ML IJ SOLN
INTRAMUSCULAR | Status: DC | PRN
  Administered 2019-09-22: 2 mg via INTRAVENOUS

## 2019-09-22 MED ORDER — ACETAMINOPHEN 500 MG PO TABS
1000.0000 mg | ORAL_TABLET | Freq: Once | ORAL | Status: AC
  Administered 2019-09-22: 12:00:00 1000 mg via ORAL
  Filled 2019-09-22: qty 2

## 2019-09-22 MED ORDER — LACTATED RINGERS IV SOLN
INTRAVENOUS | Status: DC
  Filled 2019-09-22: qty 1000

## 2019-09-22 MED ORDER — FAMOTIDINE 20 MG PO TABS
20.0000 mg | ORAL_TABLET | Freq: Once | ORAL | Status: AC
  Administered 2019-09-22: 20 mg via ORAL
  Filled 2019-09-22: qty 1

## 2019-09-22 MED ORDER — PROMETHAZINE HCL 25 MG/ML IJ SOLN
6.2500 mg | INTRAMUSCULAR | Status: DC | PRN
  Filled 2019-09-22: qty 1

## 2019-09-22 SURGICAL SUPPLY — 29 items
ADH SKN CLS APL DERMABOND .7 (GAUZE/BANDAGES/DRESSINGS) ×1
CATH ROBINSON RED A/P 16FR (CATHETERS) ×2 IMPLANT
DERMABOND ADVANCED (GAUZE/BANDAGES/DRESSINGS) ×1
DERMABOND ADVANCED .7 DNX12 (GAUZE/BANDAGES/DRESSINGS) ×1 IMPLANT
DRSG OPSITE POSTOP 3X4 (GAUZE/BANDAGES/DRESSINGS) IMPLANT
DURAPREP 26ML APPLICATOR (WOUND CARE) ×2 IMPLANT
GLOVE BIO SURGEON STRL SZ7.5 (GLOVE) ×4 IMPLANT
GLOVE BIOGEL PI IND STRL 7.0 (GLOVE) ×2 IMPLANT
GLOVE BIOGEL PI IND STRL 8.5 (GLOVE) IMPLANT
GLOVE BIOGEL PI INDICATOR 7.0 (GLOVE) ×2
GLOVE BIOGEL PI INDICATOR 8.5 (GLOVE) ×1
GLOVE INDICATOR 8.5 STRL (GLOVE) ×1 IMPLANT
GOWN STRL REUS W/ TWL LRG LVL3 (GOWN DISPOSABLE) ×2 IMPLANT
GOWN STRL REUS W/TWL LRG LVL3 (GOWN DISPOSABLE) ×4
GOWN STRL REUS W/TWL XL LVL3 (GOWN DISPOSABLE) ×1 IMPLANT
HIBICLENS CHG 4% 4OZ (MISCELLANEOUS) ×2 IMPLANT
NEEDLE INSUFFLATION 120MM (ENDOMECHANICALS) ×2 IMPLANT
PACK LAPAROSCOPY BASIN (CUSTOM PROCEDURE TRAY) ×2 IMPLANT
PACK TRENDGUARD 450 HYBRID PRO (MISCELLANEOUS) IMPLANT
PROTECTOR NERVE ULNAR (MISCELLANEOUS) ×4 IMPLANT
SET TUBE SMOKE EVAC HIGH FLOW (TUBING) ×2 IMPLANT
SOLUTION ELECTROLUBE (MISCELLANEOUS) ×1 IMPLANT
SUT VICRYL 0 UR6 27IN ABS (SUTURE) ×2 IMPLANT
SUT VICRYL 4-0 PS2 18IN ABS (SUTURE) IMPLANT
TOWEL OR 17X26 10 PK STRL BLUE (TOWEL DISPOSABLE) ×4 IMPLANT
TRENDGUARD 450 HYBRID PRO PACK (MISCELLANEOUS) ×2
TROCAR OPTI TIP 5M 100M (ENDOMECHANICALS) IMPLANT
TROCAR XCEL DIL TIP R 11M (ENDOMECHANICALS) ×2 IMPLANT
WARMER LAPAROSCOPE (MISCELLANEOUS) ×2 IMPLANT

## 2019-09-22 NOTE — Transfer of Care (Signed)
Immediate Anesthesia Transfer of Care Note  Patient: Michelle Wade  Procedure(s) Performed: LAPAROSCOPIC TUBAL LIGATION (Bilateral Abdomen)  Patient Location: PACU  Anesthesia Type:General  Level of Consciousness: awake and alert   Airway & Oxygen Therapy: Patient Spontanous Breathing and Patient connected to face mask oxygen  Post-op Assessment: Report given to RN and Post -op Vital signs reviewed and stable  Post vital signs: Reviewed and stable  Last Vitals:  Vitals Value Taken Time  BP 134/85 09/22/19 1354  Temp 36.7 C 09/22/19 1354  Pulse 96 09/22/19 1357  Resp 15 09/22/19 1357  SpO2 100 % 09/22/19 1357  Vitals shown include unvalidated device data.  Last Pain:  Vitals:   09/22/19 1114  TempSrc: Oral         Complications: No apparent anesthesia complications

## 2019-09-22 NOTE — Anesthesia Procedure Notes (Signed)
Procedure Name: Intubation Date/Time: 09/22/2019 1:15 PM Performed by: Elyn Peers, CRNA Pre-anesthesia Checklist: Patient identified, Emergency Drugs available, Suction available, Patient being monitored and Timeout performed Patient Re-evaluated:Patient Re-evaluated prior to induction Oxygen Delivery Method: Circle system utilized Preoxygenation: Pre-oxygenation with 100% oxygen Induction Type: IV induction Ventilation: Mask ventilation without difficulty Laryngoscope Size: Miller and 3 Grade View: Grade I Tube type: Oral Tube size: 7.5 mm Number of attempts: 1 Airway Equipment and Method: Stylet Placement Confirmation: ETT inserted through vocal cords under direct vision,  positive ETCO2 and breath sounds checked- equal and bilateral Secured at: 22 cm Tube secured with: Tape Dental Injury: Teeth and Oropharynx as per pre-operative assessment

## 2019-09-22 NOTE — Anesthesia Preprocedure Evaluation (Addendum)
Anesthesia Evaluation  Patient identified by MRN, date of birth, ID band Patient awake    Reviewed: Allergy & Precautions, NPO status , Patient's Chart, lab work & pertinent test results  Airway Mallampati: III  TM Distance: >3 FB Neck ROM: Full    Dental no notable dental hx.    Pulmonary neg pulmonary ROS,    Pulmonary exam normal breath sounds clear to auscultation       Cardiovascular negative cardio ROS Normal cardiovascular exam Rhythm:Regular Rate:Normal     Neuro/Psych negative neurological ROS  negative psych ROS   GI/Hepatic negative GI ROS, Neg liver ROS,   Endo/Other  negative endocrine ROS  Renal/GU negative Renal ROS     Musculoskeletal negative musculoskeletal ROS (+)   Abdominal (+) + obese,   Peds  Hematology negative hematology ROS (+)   Anesthesia Other Findings Desires Sterilization  Reproductive/Obstetrics                            Anesthesia Physical Anesthesia Plan  ASA: II  Anesthesia Plan: General   Post-op Pain Management:    Induction: Intravenous  PONV Risk Score and Plan: 4 or greater and Ondansetron, Dexamethasone, Midazolam, Scopolamine patch - Pre-op and Treatment may vary due to age or medical condition  Airway Management Planned: Oral ETT  Additional Equipment:   Intra-op Plan:   Post-operative Plan: Extubation in OR  Informed Consent: I have reviewed the patients History and Physical, chart, labs and discussed the procedure including the risks, benefits and alternatives for the proposed anesthesia with the patient or authorized representative who has indicated his/her understanding and acceptance.     Dental advisory given  Plan Discussed with: CRNA  Anesthesia Plan Comments:        Anesthesia Quick Evaluation

## 2019-09-22 NOTE — Op Note (Signed)
09/22/2019  1:48 PM  PATIENT:  Michelle Wade  34 y.o. female  PRE-OPERATIVE DIAGNOSIS:  Desires Sterilization  POST-OPERATIVE DIAGNOSIS:  Desires Sterilization  PROCEDURE:  Procedure(s): LAPAROSCOPIC TUBAL LIGATION  SURGEON:  Surgeon(s): Olivia Mackie, MD  ASSISTANTS: none   ANESTHESIA:   local and general  ESTIMATED BLOOD LOSS: minimal   DRAINS: none   LOCAL MEDICATIONS USED:  MARCAINE     SPECIMEN:  No Specimen  DISPOSITION OF SPECIMEN:  N/A  COUNTS:  YES  DICTATION #: [id number]  PLAN OF CARE: dc home  PATIENT DISPOSITION:  PACU - hemodynamically stable.

## 2019-09-22 NOTE — H&P (Signed)
Michelle Wade is an 34 y.o. female. Presents for elective sterilization.  Pertinent Gynecological History: Menses: flow is moderate Bleeding: na Contraception: none DES exposure: denies Blood transfusions: none Sexually transmitted diseases: no past history Previous GYN Procedures: na  Last mammogram: normal Date: 2020 Last pap: normal Date: 2020 OB History: G1, P1   Menstrual History: Menarche age: 49 Patient's last menstrual period was 07/16/2019.    Past Medical History:  Diagnosis Date  . COVID-19 07/13/2019   body aches, fever headache, chills loss of taste and smell, all symptoms resolved during quarantine    Past Surgical History:  Procedure Laterality Date  . CESAREAN SECTION    . MULTIPLE TOOTH EXTRACTIONS      History reviewed. No pertinent family history.  Social History:  reports that she has never smoked. She has never used smokeless tobacco. She reports that she does not use drugs. No history on file for alcohol.  Allergies: No Known Allergies  Medications Prior to Admission  Medication Sig Dispense Refill Last Dose  . Biotin 06269 MCG TABS Take by mouth.   Past Week at Unknown time  . acetaminophen (TYLENOL) 500 MG tablet Take 1,000 mg by mouth every 6 (six) hours as needed.   Unknown at Unknown time    Review of Systems  Constitutional: Negative.   All other systems reviewed and are negative.   Blood pressure (!) 134/94, pulse 94, temperature 98.2 F (36.8 C), temperature source Oral, resp. rate 16, height 5\' 7"  (1.702 m), weight 103.4 kg, last menstrual period 07/16/2019, SpO2 100 %. Physical Exam  Nursing note and vitals reviewed. Constitutional: She is oriented to person, place, and time. She appears well-developed and well-nourished.  HENT:  Head: Normocephalic.  Cardiovascular: Normal rate and regular rhythm.  Respiratory: Effort normal and breath sounds normal.  GI: Soft. Bowel sounds are normal.  Genitourinary:    Vagina and uterus  normal.   Musculoskeletal:        General: Normal range of motion.     Cervical back: Normal range of motion and neck supple.  Neurological: She is alert and oriented to person, place, and time. She has normal reflexes.  Skin: Skin is warm and dry.  Psychiatric: She has a normal mood and affect.    Results for orders placed or performed during the hospital encounter of 09/22/19 (from the past 24 hour(s))  CBC     Status: None   Collection Time: 09/22/19 11:50 AM  Result Value Ref Range   WBC 5.4 4.0 - 10.5 K/uL   RBC 4.34 3.87 - 5.11 MIL/uL   Hemoglobin 12.6 12.0 - 15.0 g/dL   HCT 11/22/19 48.5 - 46.2 %   MCV 89.9 80.0 - 100.0 fL   MCH 29.0 26.0 - 34.0 pg   MCHC 32.3 30.0 - 36.0 g/dL   RDW 70.3 50.0 - 93.8 %   Platelets 361 150 - 400 K/uL   nRBC 0.0 0.0 - 0.2 %    No results found.  Assessment/Plan: Desire for elective sterilization LTL Surgical consent done.   Kirandeep Fariss J 09/22/2019, 12:49 PM

## 2019-09-22 NOTE — Anesthesia Postprocedure Evaluation (Signed)
Anesthesia Post Note  Patient: Michelle Wade  Procedure(s) Performed: LAPAROSCOPIC TUBAL LIGATION (Bilateral Abdomen)     Patient location during evaluation: PACU Anesthesia Type: General Level of consciousness: awake and alert and oriented Pain management: pain level controlled Vital Signs Assessment: post-procedure vital signs reviewed and stable Respiratory status: spontaneous breathing, nonlabored ventilation and respiratory function stable Cardiovascular status: blood pressure returned to baseline and stable Postop Assessment: no apparent nausea or vomiting Anesthetic complications: no    Last Vitals:  Vitals:   09/22/19 1424 09/22/19 1430  BP:  129/85  Pulse: 77 (!) 101  Resp: 16 (!) 24  Temp:    SpO2: 98% 100%    Last Pain:  Vitals:   09/22/19 1430  TempSrc:   PainSc: 6                  Alcee Sipos A.

## 2019-09-22 NOTE — Discharge Instructions (Signed)
Do not take any nonsteroidal anti inflammatories until after 8:15 pm tonight.  Post Anesthesia Home Care Instructions  Activity: Get plenty of rest for the remainder of the day. A responsible individual must stay with you for 24 hours following the procedure.  For the next 24 hours, DO NOT: -Drive a car -Advertising copywriter -Drink alcoholic beverages -Take any medication unless instructed by your physician -Make any legal decisions or sign important papers.  Meals: Start with liquid foods such as gelatin or soup. Progress to regular foods as tolerated. Avoid greasy, spicy, heavy foods. If nausea and/or vomiting occur, drink only clear liquids until the nausea and/or vomiting subsides. Call your physician if vomiting continues.  Special Instructions/Symptoms: Your throat may feel dry or sore from the anesthesia or the breathing tube placed in your throat during surgery. If this causes discomfort, gargle with warm salt water. The discomfort should disappear within 24 hours.  If you had a scopolamine patch placed behind your ear for the management of post- operative nausea and/or vomiting:  1. The medication in the patch is effective for 72 hours, after which it should be removed.  Wrap patch in a tissue and discard in the trash. Wash hands thoroughly with soap and water. 2. You may remove the patch earlier than 72 hours if you experience unpleasant side effects which may include dry mouth, dizziness or visual disturbances. 3. Avoid touching the patch. Wash your hands with soap and water after contact with the patch.

## 2019-09-22 NOTE — Progress Notes (Signed)
Patient seen and examined. Consent witnessed and signed. No changes noted. Update completed. BP (!) 134/94   Pulse 94   Temp 98.2 F (36.8 C) (Oral)   Resp 16   Ht 5\' 7"  (1.702 m)   Wt 103.4 kg   LMP 07/16/2019   SpO2 100%   BMI 35.69 kg/m   CBC    Component Value Date/Time   WBC 5.4 09/22/2019 1150   RBC 4.34 09/22/2019 1150   HGB 12.6 09/22/2019 1150   HCT 39.0 09/22/2019 1150   PLT 361 09/22/2019 1150   MCV 89.9 09/22/2019 1150   MCH 29.0 09/22/2019 1150   MCHC 32.3 09/22/2019 1150   RDW 12.6 09/22/2019 1150   LYMPHSABS 1.7 02/17/2018 1605   MONOABS 0.6 02/17/2018 1605   EOSABS 0.2 02/17/2018 1605   BASOSABS 0.0 02/17/2018 1605   Spt neg

## 2019-09-22 NOTE — Op Note (Signed)
NAMEMAYLING, ABER MEDICAL RECORD [id number] ACCOUNT [id number] DATE OF BIRTH:[birth year] FACILITY: [facility] LOCATION: [facility] PHYSICIAN:Pieter Fooks J. Billy Coast, MD  OPERATIVE REPORT  DATE OF PROCEDURE:  09/22/2019  PREOPERATIVE DIAGNOSIS:  Desire for elective sterilization.  POSTOPERATIVE DIAGNOSIS:  Desire for elective sterilization.  PROCEDURE:  Laparoscopic tubal ligation with bipolar cautery and tubal interruption.  SURGEON:  Olivia Mackie, MD  ASSISTANT:  None  ANESTHESIA:  General.  ESTIMATED BLOOD LOSS:  Less than 50 mL.  COMPLICATIONS:  None.  DRAINS:  None.  COUNTS:  Correct.  DISPOSITION:  The patient was taken to recovery in good condition.  BRIEF OPERATIVE NOTE:  After being apprised of the risks of anesthesia, infection, bleeding, injury to surrounding organs, possible need for repair, delayed versus immediate complications to include bowel and bladder, vessel injury, possible need for  repair, inability to predict permanent sterilization with failure rate of 5-03/999,  the patient was brought to the operating room after consent is signed and placed in dorsal lithotomy position and administered general anesthetic without complications.   Prepped and draped in usual sterile fashion, catheterized until the bladder was empty.  Hulka tenaculum placed vaginally as exam confirms a midline to anteflexed uterus with no adnexal masses.  Infraumbilical incision made with a scalpel.  Veress  needle placed, opening pressure of 4 noted.  3.5 liters of CO2 insufflated without difficulty.  Trocar placed atraumatically.  Pictures taken.  Normal liver, omentum area, normal diaphragm.  Appendix not visualized.  Normal anterior and posterior  cul-de-sac noted.  Normal right and left tube and ovary.  Right tube was traced out to the fimbriated end.  Ampullary isthmic portion of the tube was localized, cauterized in 3 contiguous segments using bipolar cautery.  The same procedure  done on the  left.  Tubes were then both divided using straight scissors.  Tubal lumens were visualized.  Cauterization confirms minimal bleeding to no bleeding.  At this time CO2 was released and no evidence of bleeding noted.  Trocars were removed under direct  visualization.  Positive pressure x3 applied per anesthesia.  Incision closed using 0 Vicryl, 4-0 Vicryl.  Dermabond placed.  Local anesthesia placed.  Hulka tenaculum removed.  The patient tolerated the procedure well, was awakened and transferred to  recovery in good condition.  CN/NUANCE  D:09/22/2019 T:09/22/2019 JOB:[id number]

## 2019-10-20 DIAGNOSIS — Z9889 Other specified postprocedural states: Secondary | ICD-10-CM | POA: Diagnosis not present

## 2020-06-24 DIAGNOSIS — N9489 Other specified conditions associated with female genital organs and menstrual cycle: Secondary | ICD-10-CM | POA: Diagnosis not present

## 2020-06-24 DIAGNOSIS — N760 Acute vaginitis: Secondary | ICD-10-CM | POA: Diagnosis not present

## 2020-06-24 DIAGNOSIS — N898 Other specified noninflammatory disorders of vagina: Secondary | ICD-10-CM | POA: Diagnosis not present

## 2020-08-03 DIAGNOSIS — Z0000 Encounter for general adult medical examination without abnormal findings: Secondary | ICD-10-CM | POA: Diagnosis not present

## 2020-08-03 DIAGNOSIS — Z131 Encounter for screening for diabetes mellitus: Secondary | ICD-10-CM | POA: Diagnosis not present

## 2020-08-03 DIAGNOSIS — Z13220 Encounter for screening for lipoid disorders: Secondary | ICD-10-CM | POA: Diagnosis not present

## 2020-08-30 DIAGNOSIS — K080 Exfoliation of teeth due to systemic causes: Secondary | ICD-10-CM | POA: Diagnosis not present

## 2020-09-06 DIAGNOSIS — Z01411 Encounter for gynecological examination (general) (routine) with abnormal findings: Secondary | ICD-10-CM | POA: Diagnosis not present

## 2020-09-06 DIAGNOSIS — Z113 Encounter for screening for infections with a predominantly sexual mode of transmission: Secondary | ICD-10-CM | POA: Diagnosis not present

## 2020-09-06 DIAGNOSIS — Z6832 Body mass index (BMI) 32.0-32.9, adult: Secondary | ICD-10-CM | POA: Diagnosis not present

## 2020-09-06 DIAGNOSIS — Z124 Encounter for screening for malignant neoplasm of cervix: Secondary | ICD-10-CM | POA: Diagnosis not present

## 2020-09-06 DIAGNOSIS — Z01419 Encounter for gynecological examination (general) (routine) without abnormal findings: Secondary | ICD-10-CM | POA: Diagnosis not present

## 2021-08-18 DIAGNOSIS — Z9851 Tubal ligation status: Secondary | ICD-10-CM | POA: Diagnosis not present

## 2022-01-09 DIAGNOSIS — N9489 Other specified conditions associated with female genital organs and menstrual cycle: Secondary | ICD-10-CM | POA: Diagnosis not present

## 2022-01-09 DIAGNOSIS — N898 Other specified noninflammatory disorders of vagina: Secondary | ICD-10-CM | POA: Diagnosis not present

## 2022-01-09 DIAGNOSIS — A609 Anogenital herpesviral infection, unspecified: Secondary | ICD-10-CM | POA: Diagnosis not present

## 2022-01-22 ENCOUNTER — Emergency Department (HOSPITAL_BASED_OUTPATIENT_CLINIC_OR_DEPARTMENT_OTHER)
Admission: EM | Admit: 2022-01-22 | Discharge: 2022-01-22 | Disposition: A | Discharge status: Home / Self Care | Payer: Federal, State, Local not specified - PPO | Attending: Emergency Medicine | Admitting: Emergency Medicine

## 2022-01-22 ENCOUNTER — Other Ambulatory Visit: Payer: Self-pay

## 2022-01-22 ENCOUNTER — Encounter (HOSPITAL_BASED_OUTPATIENT_CLINIC_OR_DEPARTMENT_OTHER): Payer: Self-pay | Admitting: Emergency Medicine

## 2022-01-22 ENCOUNTER — Emergency Department (HOSPITAL_BASED_OUTPATIENT_CLINIC_OR_DEPARTMENT_OTHER): Payer: Federal, State, Local not specified - PPO

## 2022-01-22 DIAGNOSIS — D649 Anemia, unspecified: Secondary | ICD-10-CM

## 2022-01-22 DIAGNOSIS — R059 Cough, unspecified: Secondary | ICD-10-CM | POA: Diagnosis not present

## 2022-01-22 DIAGNOSIS — R000 Tachycardia, unspecified: Secondary | ICD-10-CM | POA: Insufficient documentation

## 2022-01-22 DIAGNOSIS — U071 COVID-19: Secondary | ICD-10-CM | POA: Diagnosis not present

## 2022-01-22 DIAGNOSIS — R0789 Other chest pain: Secondary | ICD-10-CM | POA: Diagnosis not present

## 2022-01-22 DIAGNOSIS — R0602 Shortness of breath: Secondary | ICD-10-CM | POA: Diagnosis not present

## 2022-01-22 LAB — CBC WITH DIFFERENTIAL/PLATELET
Abs Immature Granulocytes: 0.03 10*3/uL (ref 0.00–0.07)
Basophils Absolute: 0 10*3/uL (ref 0.0–0.1)
Basophils Relative: 1 %
Eosinophils Absolute: 0.1 10*3/uL (ref 0.0–0.5)
Eosinophils Relative: 1 %
HCT: 31.8 % — ABNORMAL LOW (ref 36.0–46.0)
Hemoglobin: 9.8 g/dL — ABNORMAL LOW (ref 12.0–15.0)
Immature Granulocytes: 1 %
Lymphocytes Relative: 5 %
Lymphs Abs: 0.3 10*3/uL — ABNORMAL LOW (ref 0.7–4.0)
MCH: 24.9 pg — ABNORMAL LOW (ref 26.0–34.0)
MCHC: 30.8 g/dL (ref 30.0–36.0)
MCV: 80.7 fL (ref 80.0–100.0)
Monocytes Absolute: 0.9 10*3/uL (ref 0.1–1.0)
Monocytes Relative: 16 %
Neutro Abs: 4.5 10*3/uL (ref 1.7–7.7)
Neutrophils Relative %: 76 %
Platelets: 417 10*3/uL — ABNORMAL HIGH (ref 150–400)
RBC: 3.94 MIL/uL (ref 3.87–5.11)
RDW: 14.2 % (ref 11.5–15.5)
WBC: 5.9 10*3/uL (ref 4.0–10.5)
nRBC: 0 % (ref 0.0–0.2)

## 2022-01-22 LAB — BASIC METABOLIC PANEL
Anion gap: 8 (ref 5–15)
BUN: 6 mg/dL (ref 6–20)
CO2: 26 mmol/L (ref 22–32)
Calcium: 9.5 mg/dL (ref 8.9–10.3)
Chloride: 105 mmol/L (ref 98–111)
Creatinine, Ser: 0.78 mg/dL (ref 0.44–1.00)
GFR, Estimated: >60 mL/min (ref >60)
Glucose, Bld: 103 mg/dL — ABNORMAL HIGH (ref 70–99)
Potassium: 3.7 mmol/L (ref 3.5–5.1)
Sodium: 139 mmol/L (ref 135–145)

## 2022-01-22 LAB — TROPONIN I (HIGH SENSITIVITY): Troponin I (High Sensitivity): 2 ng/L (ref <18)

## 2022-01-22 LAB — SARS CORONAVIRUS 2 BY RT PCR: SARS Coronavirus 2 by RT PCR: POSITIVE — AB

## 2022-01-22 LAB — BRAIN NATRIURETIC PEPTIDE: B Natriuretic Peptide: 22.4 pg/mL (ref 0.0–100.0)

## 2022-01-22 LAB — D-DIMER, QUANTITATIVE: D-Dimer, Quant: <0.27 ug/mL-FEU (ref 0.00–0.50)

## 2022-01-22 MED ORDER — LIDOCAINE VISCOUS HCL 2 % MT SOLN
15.0000 mL | Freq: Once | OROMUCOSAL | Status: AC
  Administered 2022-01-22: 15 mL via ORAL
  Filled 2022-01-22: qty 15

## 2022-01-22 MED ORDER — IBUPROFEN 800 MG PO TABS
800.0000 mg | ORAL_TABLET | Freq: Once | ORAL | Status: AC
  Administered 2022-01-22: 800 mg via ORAL
  Filled 2022-01-22: qty 1

## 2022-01-22 MED ORDER — NIRMATRELVIR/RITONAVIR (PAXLOVID)TABLET: 3.0000 | ORAL_TABLET | Freq: Two times a day (BID) | ORAL | 0 refills | Status: AC

## 2022-01-22 MED ORDER — ALUM & MAG HYDROXIDE-SIMETH 200-200-20 MG/5ML PO SUSP
30.0000 mL | Freq: Once | ORAL | Status: AC
  Administered 2022-01-22: 30 mL via ORAL
  Filled 2022-01-22: qty 30

## 2022-01-22 NOTE — Discharge Instructions (Signed)
You are seen in the emergency department for chest pain and shortness of breath.  Your work-up was reassuring, you have COVID-19 but no signs of injury to your heart or lungs.  Take the Paxlovid twice daily for 5 days, you also slightly anemic so he should have your blood count rechecked in a few months.  Follow-up with your primary for that.  Return to the ED if things change or worsen.

## 2022-01-22 NOTE — ED Triage Notes (Signed)
Chest pain  and shortness of breath , dry cough and chest congestion started yesterday .

## 2022-01-22 NOTE — ED Notes (Signed)
Pt c/o GERD s/s. ED PA notified. Orders received and implemented.

## 2022-01-22 NOTE — ED Provider Notes (Signed)
[facility] EMERGENCY DEPARTMENT Provider Note   CSN: [id number] Arrival date & time: 01/22/22  0908     History  Chief Complaint  Patient presents with   Chest Pain    Michelle Wade is a 36 y.o. female.   Chest Pain   Patient presents with chest pain and shortness of breath.  Her symptoms started yesterday with chest pain.  It is pleuritic in nature, feels it in the center of her chest and it is a sharp pain.  Does not radiate elsewhere, no associated nausea or vomiting.  She is having a dry nonproductive cough, no lower extremity swelling.  States all of her family are sick at home with COVID.  She status post tubal ligation, not on oral birth control.  No history of previous PE or ACS.  Did not take any medicine prior to arrival, also associated symptoms include headache and generalized myalgias.  Patient denies any tobacco usage.  Home Medications Prior to Admission medications   Medication Sig Start Date End Date Taking? Authorizing Provider  nirmatrelvir/ritonavir EUA (PAXLOVID) 20 x 150 MG & 10 x 100MG  TABS Take 3 tablets by mouth 2 (two) times daily for 5 days. Patient GFR is >60. Take nirmatrelvir (150 mg) two tablets twice daily for 5 days and ritonavir (100 mg) one tablet twice daily for 5 days. 01/22/22 01/27/22 Yes 03/29/22, PA-C  acetaminophen (TYLENOL) 500 MG tablet Take 1,000 mg by mouth every 6 (six) hours as needed.    [provider]  Biotin Theron Arista MCG TABS Take by mouth.    [provider]      Allergies    Patient has no known allergies.    Review of Systems   Review of Systems  Cardiovascular:  Positive for chest pain.    Physical Exam Updated Vital Signs BP 122/80   Pulse (!) 103   Temp 100.3 F (37.9 C)   Resp 19   Ht 5\' 8"  (1.727 m)   Wt 87.1 kg   LMP 01/16/2022 Comment: tubal ligation  SpO2 100%   BMI 29.19 kg/m  Physical Exam Vitals and nursing note reviewed. Exam conducted with a chaperone present.   Constitutional:      Appearance: Normal appearance.  HENT:     Head: Normocephalic and atraumatic.  Eyes:     General: No scleral icterus.       Right eye: No discharge.        Left eye: No discharge.     Extraocular Movements: Extraocular movements intact.     Pupils: Pupils are equal, round, and reactive to light.  Cardiovascular:     Rate and Rhythm: Regular rhythm. Tachycardia present.     Pulses: Normal pulses.     Heart sounds: Normal heart sounds. No murmur heard.    No friction rub. No gallop.  Pulmonary:     Effort: Pulmonary effort is normal. No respiratory distress.     Breath sounds: Normal breath sounds.  Abdominal:     General: Abdomen is flat. Bowel sounds are normal. There is no distension.     Palpations: Abdomen is soft.     Tenderness: There is no abdominal tenderness.  Musculoskeletal:     Right lower leg: No edema.     Left lower leg: No edema.  Skin:    General: Skin is warm and dry.     Coloration: Skin is not jaundiced.  Neurological:     Mental Status: She is alert. Mental  status is at baseline.     Coordination: Coordination normal.     ED Results / Procedures / Treatments   Labs (all labs ordered are listed, but only abnormal results are displayed) Labs Reviewed  SARS CORONAVIRUS 2 BY RT PCR - Abnormal; Notable for the following components:      Result Value   SARS Coronavirus 2 by RT PCR POSITIVE (*)    All other components within normal limits  BASIC METABOLIC PANEL - Abnormal; Notable for the following components:   Glucose, Bld 103 (*)    All other components within normal limits  CBC WITH DIFFERENTIAL/PLATELET - Abnormal; Notable for the following components:   Hemoglobin 9.8 (*)    HCT 31.8 (*)    MCH 24.9 (*)    Platelets 417 (*)    Lymphs Abs 0.3 (*)    All other components within normal limits  BRAIN NATRIURETIC PEPTIDE  D-DIMER, QUANTITATIVE  TROPONIN I (HIGH SENSITIVITY)    EKG EKG Interpretation  Date/Time:  Monday  January 22 2022 09:20:43 EDT Ventricular Rate:  112 PR Interval:  160 QRS Duration: 84 QT Interval:  311 QTC Calculation: 425 R Axis:   58 Text Interpretation: Sinus tachycardia Abnormal R-wave progression, early transition no prior ECG for comparison. No STEMI Confirmed by Theda Belfast ([id number]) on 01/22/2022 9:35:26 AM  Radiology DG Chest Portable 1 View  Result Date: 01/22/2022 CLINICAL DATA:  Cough and shortness of breath. EXAM: PORTABLE CHEST 1 VIEW COMPARISON:  None Available. FINDINGS: The lungs are clear without focal pneumonia, edema, pneumothorax or pleural effusion. The cardiopericardial silhouette is within normal limits for size. The visualized bony structures of the thorax are unremarkable. Telemetry leads overlie the chest. IMPRESSION: No active disease. Electronically Signed   By: Kennith Center M.D.   On: 01/22/2022 09:43    Procedures Procedures    Medications Ordered in ED Medications  ibuprofen (ADVIL) tablet 800 mg (800 mg Oral Given 01/22/22 1022)  alum & mag hydroxide-simeth (MAALOX/MYLANTA) 200-200-20 MG/5ML suspension 30 mL (30 mLs Oral Given 01/22/22 1046)    And  lidocaine (XYLOCAINE) 2 % viscous mouth solution 15 mL (15 mLs Oral Given 01/22/22 1046)    ED Course/ Medical Decision Making/ A&P                           Medical Decision Making Amount and/or Complexity of Data Reviewed Labs: ordered. Radiology: ordered.  Risk OTC drugs. Prescription drug management.   Patient presents with chest pain and shortness of breath.  Differential includes but not limited to COVID, PE, ACS, pneumonia, pericarditis, dissection, Boerhaave's esophagitis, PTX.  On exam patient is tachycardic with regular rhythm, her temperature is elevated 100.3 but not technically febrile.  Not hypoxic, blood pressure mildly elevated 133/92.  No lower extremity edema, lungs clear to auscultation bilaterally.  I ordered and viewed the EKG.  Patient is in sinus tachycardia, no ischemic  findings.  Patient is on cardiac monitoring in the room.  Per my interpretation patient is in sinus tachycardia with a rate of 112.  I ordered and viewed and reviewed plain film of chest.  Per my interpretation, interpretation patient has a normal cardiac silhouette without any acute process.  I agree with radiologist interpretation.  Specifically, no pneumonia, focal consolidation, widened mediastinum, cardiomegaly.  I reviewed patient's home medication list, she does not take any daily medicine.  I ordered 800 ibuprofen for the patient.  GI cocktail also provided.  I ordered and viewed laboratory work-up. Per my interpretation:  CBC without leukocytosis.  Patient has stable anemia with hemoglobin of 9.8.  Per chart review compared to 2 years ago this is decreased.  Platelets slightly elevated at 417 Patient is COVID-positive. Dimer is negative, not consistent with a PE. Negative troponin and BNP, no signs of cardiomegaly or EKG changes.  I do not think that consistent with heart failure or ACS. BMP without gross electrolyte derangement or AKI.  Patient renal function is normal and a great candidate for Paxlovid.  Discussed work-up with patient.  Encouraged her to follow-up with PCP regarding the anemia, Paxlovid to pharmacy at her request.  I do not see any signs of emergent disease requiring hospitalization, although she does have COVID she is not hypoxic and she is only mildly tachycardic on reevaluation.  Patient discharged in stable condition         Final Clinical Impression(s) / ED Diagnoses Final diagnoses:  COVID-19  Anemia, unspecified type    Rx / DC Orders ED Discharge Orders          Ordered    nirmatrelvir/ritonavir EUA (PAXLOVID) 20 x 150 MG & 10 x 100MG  TABS  2 times daily        01/22/22 1140              03/24/22, Theron Arista 01/22/22 1750    Tegeler, 03/24/22, MD 01/23/22 [phone]

## 2022-07-10 DIAGNOSIS — H938X1 Other specified disorders of right ear: Secondary | ICD-10-CM | POA: Diagnosis not present

## 2023-02-27 DIAGNOSIS — Z0000 Encounter for general adult medical examination without abnormal findings: Secondary | ICD-10-CM | POA: Diagnosis not present

## 2023-02-27 DIAGNOSIS — D649 Anemia, unspecified: Secondary | ICD-10-CM | POA: Diagnosis not present

## 2023-02-27 DIAGNOSIS — R2242 Localized swelling, mass and lump, left lower limb: Secondary | ICD-10-CM | POA: Diagnosis not present

## 2023-02-27 DIAGNOSIS — Z789 Other specified health status: Secondary | ICD-10-CM | POA: Diagnosis not present

## 2023-02-27 DIAGNOSIS — Z13220 Encounter for screening for lipoid disorders: Secondary | ICD-10-CM | POA: Diagnosis not present

## 2023-02-27 DIAGNOSIS — R42 Dizziness and giddiness: Secondary | ICD-10-CM | POA: Diagnosis not present

## 2023-03-13 DIAGNOSIS — Z01419 Encounter for gynecological examination (general) (routine) without abnormal findings: Secondary | ICD-10-CM | POA: Diagnosis not present

## 2023-03-13 DIAGNOSIS — Z1331 Encounter for screening for depression: Secondary | ICD-10-CM | POA: Diagnosis not present

## 2023-04-12 ENCOUNTER — Emergency Department (HOSPITAL_BASED_OUTPATIENT_CLINIC_OR_DEPARTMENT_OTHER): Payer: Federal, State, Local not specified - PPO

## 2023-04-12 ENCOUNTER — Encounter (HOSPITAL_BASED_OUTPATIENT_CLINIC_OR_DEPARTMENT_OTHER): Payer: Self-pay

## 2023-04-12 ENCOUNTER — Other Ambulatory Visit: Payer: Self-pay

## 2023-04-12 ENCOUNTER — Emergency Department (HOSPITAL_BASED_OUTPATIENT_CLINIC_OR_DEPARTMENT_OTHER)
Admission: EM | Admit: 2023-04-12 | Discharge: 2023-04-12 | Disposition: A | Discharge status: Home / Self Care | Payer: Federal, State, Local not specified - PPO | Attending: Emergency Medicine | Admitting: Emergency Medicine

## 2023-04-12 DIAGNOSIS — R0600 Dyspnea, unspecified: Secondary | ICD-10-CM | POA: Diagnosis not present

## 2023-04-12 DIAGNOSIS — Z20822 Contact with and (suspected) exposure to covid-19: Secondary | ICD-10-CM | POA: Insufficient documentation

## 2023-04-12 DIAGNOSIS — J181 Lobar pneumonia, unspecified organism: Secondary | ICD-10-CM | POA: Diagnosis not present

## 2023-04-12 DIAGNOSIS — Z8616 Personal history of COVID-19: Secondary | ICD-10-CM | POA: Diagnosis not present

## 2023-04-12 DIAGNOSIS — R059 Cough, unspecified: Secondary | ICD-10-CM | POA: Diagnosis not present

## 2023-04-12 DIAGNOSIS — J189 Pneumonia, unspecified organism: Secondary | ICD-10-CM | POA: Diagnosis not present

## 2023-04-12 DIAGNOSIS — R509 Fever, unspecified: Secondary | ICD-10-CM | POA: Diagnosis not present

## 2023-04-12 DIAGNOSIS — R058 Other specified cough: Secondary | ICD-10-CM | POA: Diagnosis not present

## 2023-04-12 LAB — RESP PANEL BY RT-PCR (RSV, FLU A&B, COVID)  RVPGX2
Influenza A by PCR: NEGATIVE
Influenza B by PCR: NEGATIVE
Resp Syncytial Virus by PCR: NEGATIVE
SARS Coronavirus 2 by RT PCR: NEGATIVE

## 2023-04-12 MED ORDER — IBUPROFEN 600 MG PO TABS: 600.0000 mg | ORAL_TABLET | Freq: Four times a day (QID) | ORAL | 0 refills | Status: AC | PRN

## 2023-04-12 MED ORDER — ACETAMINOPHEN 500 MG PO TABS
1000.0000 mg | ORAL_TABLET | Freq: Once | ORAL | Status: DC
  Filled 2023-04-12: qty 2

## 2023-04-12 MED ORDER — AMOXICILLIN 500 MG PO CAPS
1000.0000 mg | ORAL_CAPSULE | Freq: Once | ORAL | Status: AC
  Administered 2023-04-12: 1000 mg via ORAL
  Filled 2023-04-12: qty 2

## 2023-04-12 MED ORDER — ALBUTEROL SULFATE HFA 108 (90 BASE) MCG/ACT IN AERS: 2.0000 | INHALATION_SPRAY | RESPIRATORY_TRACT | Status: DC | PRN

## 2023-04-12 MED ORDER — BENZONATATE 100 MG PO CAPS: 100.0000 mg | ORAL_CAPSULE | Freq: Three times a day (TID) | ORAL | 0 refills | Status: AC | PRN

## 2023-04-12 MED ORDER — AMOXICILLIN 500 MG PO CAPS: 1000.0000 mg | ORAL_CAPSULE | Freq: Three times a day (TID) | ORAL | 0 refills | Status: AC

## 2023-04-12 MED ORDER — IBUPROFEN 800 MG PO TABS
800.0000 mg | ORAL_TABLET | Freq: Once | ORAL | Status: AC
  Administered 2023-04-12: 800 mg via ORAL
  Filled 2023-04-12: qty 1

## 2023-04-12 MED ORDER — ACETAMINOPHEN 325 MG PO TABS
650.0000 mg | ORAL_TABLET | Freq: Once | ORAL | Status: AC | PRN
  Administered 2023-04-12: 650 mg via ORAL
  Filled 2023-04-12: qty 2

## 2023-04-12 NOTE — ED Triage Notes (Signed)
The patient has had body aches, cough and shortness of breath since Sunday.

## 2023-04-12 NOTE — ED Provider Notes (Signed)
[facility] Provider Note  CSN: [id number] Arrival date & time: 04/12/23 2001  Chief Complaint(s) Cough and Shortness of Breath  HPI Michelle Wade is a 37 y.o. female with no significant past medical history presenting to the emergency department for cough.  She reports that she has had cough for around a week.  She is also having some intermittent fevers, body aches, fatigue.  She reports her cough is productive.  She reports occasional shortness of breath.  No orthopnea, leg swelling.  No recent travel or surgery.  No history of DVT or PE.  No runny nose or sore throat.  No sick contacts.   Past Medical History Past Medical History:  Diagnosis Date   COVID-19 07/13/2019   body aches, fever headache, chills loss of taste and smell, all symptoms resolved during quarantine   There are no problems to display for this patient.  Home Medication(s) Prior to Admission medications   Medication Sig Start Date End Date Taking? Authorizing Provider  amoxicillin (AMOXIL) 500 MG capsule Take 2 capsules (1,000 mg total) by mouth 3 (three) times daily for 7 days. 04/12/23 04/19/23 Yes Lonell Grandchild, MD  benzonatate (TESSALON) 100 MG capsule Take 1 capsule (100 mg total) by mouth 3 (three) times daily as needed for cough. 04/12/23  Yes Lonell Grandchild, MD  ibuprofen (ADVIL) 600 MG tablet Take 1 tablet (600 mg total) by mouth every 6 (six) hours as needed. 04/12/23  Yes Lonell Grandchild, MD  acetaminophen (TYLENOL) 500 MG tablet Take 1,000 mg by mouth every 6 (six) hours as needed.    [provider]  Biotin 40981 MCG TABS Take by mouth.    [provider]                                                                                                                                    Past Surgical History Past Surgical History:  Procedure Laterality Date   CESAREAN SECTION     LAPAROSCOPIC TUBAL LIGATION Bilateral  09/22/2019   Procedure: LAPAROSCOPIC TUBAL LIGATION;  Surgeon: Olivia Mackie, MD;  Location: [facility];  Service: Gynecology;  Laterality: Bilateral;   MULTIPLE TOOTH EXTRACTIONS     Family History History reviewed. No pertinent family history.  Social History Social History   Tobacco Use   Smoking status: Never   Smokeless tobacco: Never  Vaping Use   Vaping status: Never Used  Substance Use Topics   Drug use: Never   Allergies Patient has no known allergies.  Review of Systems Review of Systems  All other systems reviewed and are negative.   Physical Exam Vital Signs  I have reviewed the triage vital signs BP (!) 140/95   Pulse 83   Temp (!) 101.6 F (38.7 C) (Oral)   Resp 18   Ht 5\' 8"  (1.727 m)   Wt 82.1 kg  LMP 04/09/2023 (Exact Date)   SpO2 98%   BMI 27.52 kg/m  Physical Exam Vitals and nursing note reviewed.  Constitutional:      General: She is not in acute distress.    Appearance: She is well-developed.  HENT:     Head: Normocephalic and atraumatic.     Mouth/Throat:     Mouth: Mucous membranes are moist.  Eyes:     Pupils: Pupils are equal, round, and reactive to light.  Cardiovascular:     Rate and Rhythm: Normal rate and regular rhythm.     Heart sounds: No murmur heard. Pulmonary:     Effort: Pulmonary effort is normal. No respiratory distress.     Breath sounds: Examination of the right-lower field reveals rales. Rales present.  Abdominal:     General: Abdomen is flat.     Palpations: Abdomen is soft.     Tenderness: There is no abdominal tenderness.  Musculoskeletal:        General: No tenderness.     Right lower leg: No edema.     Left lower leg: No edema.  Skin:    General: Skin is warm and dry.  Neurological:     General: No focal deficit present.     Mental Status: She is alert. Mental status is at baseline.  Psychiatric:        Mood and Affect: Mood normal.        Behavior: Behavior normal.     ED  Results and Treatments Labs (all labs ordered are listed, but only abnormal results are displayed) Labs Reviewed  RESP PANEL BY RT-PCR (RSV, FLU A&B, COVID)  RVPGX2                                                                                                                          Radiology DG Chest 2 View  Addendum Date: 04/12/2023   ADDENDUM REPORT: 04/12/2023 22:02 ADDENDUM: There is an error in both the findings and impression of the above report. The focal consolidation is within the RIGHT lower lobe in keeping with an acute RIGHT lower lobar pneumonia in the appropriate clinical setting. These findings were confirmed with Dr. Suezanne Jacquet at the time of this addendum. Electronically Signed   By: Helyn Numbers M.D.   On: 04/12/2023 22:02   Result Date: 04/12/2023 CLINICAL DATA:  Dyspnea, fever, productive cough EXAM: CHEST - 2 VIEW COMPARISON:  01/22/2022 FINDINGS: There is focal consolidation within the left lower lobe keeping with acute lobar pneumonia in the appropriate clinical setting. No pneumothorax or pleural effusion. Cardiac size within normal limits. Pulmonary vascularity is normal. No acute bone abnormality. IMPRESSION: 1. Left lower lobe pneumonia. Electronically Signed: By: Helyn Numbers M.D. On: 04/12/2023 21:34    Pertinent labs & imaging results that were available during my care of the patient were reviewed by me and considered in my medical decision making (see MDM for details).  Medications Ordered in ED Medications  albuterol (VENTOLIN  HFA) 108 (90 Base) MCG/ACT inhaler 2 puff (has no administration in time range)  acetaminophen (TYLENOL) tablet 650 mg (650 mg Oral Given 04/12/23 2012)  amoxicillin (AMOXIL) capsule 1,000 mg (1,000 mg Oral Given 04/12/23 2221)  ibuprofen (ADVIL) tablet 800 mg (800 mg Oral Given 04/12/23 2227)                                                                                                                                      Procedures Procedures  (including critical care time)  Medical Decision Making / ED Course   MDM:  37 year old female presenting with cough.  Suspect pneumonia.  She does have some right lower lobe crackles.  Chest x-ray does not demonstrate right lower lobe infiltrate.  No pneumothorax.  COVID test is negative.  She is otherwise very healthy.  Will treat with amoxicillin 1 g 3 times daily.  Will prescribe Tessalon Perles.  She is not taking anything for symptoms like Tylenol and Motrin so encouraged this.  Will discharge patient to home. All questions answered. Patient comfortable with plan of discharge. Return precautions discussed with patient and specified on the after visit summary.        Lab Tests: -I ordered, reviewed, and interpreted labs.   The pertinent results include:   Labs Reviewed  RESP PANEL BY RT-PCR (RSV, FLU A&B, COVID)  RVPGX2    Notable for negative covid   Imaging Studies ordered: I ordered imaging studies including CXR On my interpretation imaging demonstrates pneumonia I independently visualized and interpreted imaging. I agree with the radiologist interpretation   Medicines ordered and prescription drug management: Meds ordered this encounter  Medications   albuterol (VENTOLIN HFA) 108 (90 Base) MCG/ACT inhaler 2 puff   acetaminophen (TYLENOL) tablet 650 mg   DISCONTD: acetaminophen (TYLENOL) tablet 1,000 mg   amoxicillin (AMOXIL) capsule 1,000 mg   amoxicillin (AMOXIL) 500 MG capsule    Sig: Take 2 capsules (1,000 mg total) by mouth 3 (three) times daily for 7 days.    Dispense:  42 capsule    Refill:  0   benzonatate (TESSALON) 100 MG capsule    Sig: Take 1 capsule (100 mg total) by mouth 3 (three) times daily as needed for cough.    Dispense:  21 capsule    Refill:  0   ibuprofen (ADVIL) 600 MG tablet    Sig: Take 1 tablet (600 mg total) by mouth every 6 (six) hours as needed.    Dispense:  30 tablet    Refill:  0   ibuprofen (ADVIL)  tablet 800 mg    -I have reviewed the patients home medicines and have made adjustments as needed    Reevaluation: After the interventions noted above, I reevaluated the patient and found that their symptoms have improved  Co morbidities that complicate the patient evaluation  Past Medical History:  Diagnosis Date   COVID-19 07/13/2019  body aches, fever headache, chills loss of taste and smell, all symptoms resolved during quarantine      Dispostion: Disposition decision including need for hospitalization was considered, and patient discharged from emergency department.    Final Clinical Impression(s) / ED Diagnoses Final diagnoses:  Community acquired pneumonia of right lower lobe of lung     This chart was dictated using voice recognition software.  Despite best efforts to proofread,  errors can occur which can change the documentation meaning.    Lonell Grandchild, MD 04/12/23 [phone]

## 2023-04-12 NOTE — ED Notes (Signed)
ED Provider at bedside. 

## 2023-04-12 NOTE — Discharge Instructions (Addendum)
We evaluated you for your cough, fevers and bodyaches.  Your chest x-ray shows a pneumonia in your right lung.  We have prescribed you an antibiotic for this.  Please take this 3 times daily for 7 days.  Please take Tylenol and Motrin for your symptoms at home.  You can take 1000 mg of Tylenol every 6 hours and 600 mg of ibuprofen every 6 hours as needed for your symptoms.  You can take these medicines together as needed, either at the same time, or alternating every 3 hours.  I have also prescribed you a medication called Tessalon for cough.  You can take this up to 3 times daily.  It does not work for everybody, so if it does not seem to help you can stop taking it.  Please follow-up closely with your primary doctor.  You will need a repeat x-ray to make sure that the pneumonia has resolved.  If you have any new or worsening symptoms such as severe pain, difficulty breathing, lightheadedness or dizziness, fainting, or any other worsening symptoms, please return for reassessment.

## 2023-04-16 DIAGNOSIS — B59 Pneumocystosis: Secondary | ICD-10-CM | POA: Diagnosis not present

## 2023-04-22 DIAGNOSIS — B59 Pneumocystosis: Secondary | ICD-10-CM | POA: Diagnosis not present

## 2023-04-23 DIAGNOSIS — J9801 Acute bronchospasm: Secondary | ICD-10-CM | POA: Diagnosis not present

## 2023-04-23 DIAGNOSIS — R011 Cardiac murmur, unspecified: Secondary | ICD-10-CM | POA: Diagnosis not present

## 2023-05-06 DIAGNOSIS — Z8701 Personal history of pneumonia (recurrent): Secondary | ICD-10-CM | POA: Diagnosis not present

## 2023-05-06 DIAGNOSIS — D509 Iron deficiency anemia, unspecified: Secondary | ICD-10-CM | POA: Diagnosis not present
